# Patient Record
Sex: Female | Born: 1965
Health system: Southern US, Community
[De-identification: ages and names within clinical notes are randomized; demographics above are authoritative.]

## PROBLEM LIST (undated history)

## (undated) DIAGNOSIS — F1910 Other psychoactive substance abuse, uncomplicated: Secondary | ICD-10-CM

## (undated) DIAGNOSIS — T7840XA Allergy, unspecified, initial encounter: Secondary | ICD-10-CM

## (undated) DIAGNOSIS — K219 Gastro-esophageal reflux disease without esophagitis: Secondary | ICD-10-CM

## (undated) HISTORY — DX: Other psychoactive substance abuse, uncomplicated: F19.10

## (undated) HISTORY — DX: Allergy, unspecified, initial encounter: T78.40XA

## (undated) HISTORY — DX: Gastro-esophageal reflux disease without esophagitis: K21.9

---

## 2000-10-10 ENCOUNTER — Inpatient Hospital Stay (HOSPITAL_COMMUNITY): Admission: AD | Admit: 2000-10-10 | Discharge: 2000-10-10 | Payer: Self-pay | Admitting: *Deleted

## 2000-11-21 ENCOUNTER — Inpatient Hospital Stay (HOSPITAL_COMMUNITY): Admission: AD | Admit: 2000-11-21 | Discharge: 2000-11-21 | Payer: Self-pay | Admitting: Obstetrics and Gynecology

## 2004-08-05 ENCOUNTER — Ambulatory Visit: Payer: Self-pay | Admitting: Gynecology

## 2004-08-07 ENCOUNTER — Ambulatory Visit: Payer: Self-pay | Admitting: Gynecology

## 2006-08-10 ENCOUNTER — Ambulatory Visit: Payer: Self-pay | Admitting: Gynecology

## 2006-08-10 ENCOUNTER — Encounter (INDEPENDENT_AMBULATORY_CARE_PROVIDER_SITE_OTHER): Payer: Self-pay | Admitting: Gynecology

## 2006-08-18 ENCOUNTER — Ambulatory Visit: Payer: Self-pay | Admitting: Gynecology

## 2006-08-24 ENCOUNTER — Ambulatory Visit: Payer: Self-pay | Admitting: Gynecology

## 2007-07-02 DIAGNOSIS — L570 Actinic keratosis: Secondary | ICD-10-CM | POA: Insufficient documentation

## 2008-04-12 ENCOUNTER — Ambulatory Visit: Payer: Self-pay | Admitting: Family Medicine

## 2008-04-24 ENCOUNTER — Ambulatory Visit: Payer: Self-pay | Admitting: Family Medicine

## 2008-08-29 ENCOUNTER — Ambulatory Visit: Payer: Self-pay | Admitting: Family Medicine

## 2008-09-18 ENCOUNTER — Ambulatory Visit: Payer: Self-pay | Admitting: Family Medicine

## 2011-03-17 ENCOUNTER — Ambulatory Visit: Payer: Self-pay | Admitting: Family Medicine

## 2013-04-18 ENCOUNTER — Ambulatory Visit: Payer: Self-pay | Admitting: Family Medicine

## 2013-05-02 ENCOUNTER — Ambulatory Visit: Payer: Self-pay | Admitting: Family Medicine

## 2016-04-15 ENCOUNTER — Ambulatory Visit: Payer: Self-pay | Admitting: Family Medicine

## 2016-05-07 ENCOUNTER — Encounter: Payer: Self-pay | Admitting: Family Medicine

## 2016-05-14 ENCOUNTER — Encounter: Payer: Self-pay | Admitting: Family Medicine

## 2016-06-11 ENCOUNTER — Encounter: Payer: Self-pay | Admitting: Family Medicine

## 2016-06-17 ENCOUNTER — Encounter: Payer: Self-pay | Admitting: Family Medicine

## 2016-06-17 ENCOUNTER — Ambulatory Visit (INDEPENDENT_AMBULATORY_CARE_PROVIDER_SITE_OTHER): Payer: BLUE CROSS/BLUE SHIELD | Admitting: Family Medicine

## 2016-06-17 VITALS — BP 138/78 | HR 96 | Temp 98.1°F | Resp 16 | Ht 66.0 in | Wt 165.0 lb

## 2016-06-17 DIAGNOSIS — Z13220 Encounter for screening for lipoid disorders: Secondary | ICD-10-CM

## 2016-06-17 DIAGNOSIS — Z1239 Encounter for other screening for malignant neoplasm of breast: Secondary | ICD-10-CM

## 2016-06-17 DIAGNOSIS — Z6826 Body mass index (BMI) 26.0-26.9, adult: Secondary | ICD-10-CM | POA: Diagnosis not present

## 2016-06-17 DIAGNOSIS — Z1231 Encounter for screening mammogram for malignant neoplasm of breast: Secondary | ICD-10-CM

## 2016-06-17 DIAGNOSIS — Z131 Encounter for screening for diabetes mellitus: Secondary | ICD-10-CM

## 2016-06-17 DIAGNOSIS — Z0000 Encounter for general adult medical examination without abnormal findings: Secondary | ICD-10-CM | POA: Diagnosis not present

## 2016-06-17 DIAGNOSIS — Z124 Encounter for screening for malignant neoplasm of cervix: Secondary | ICD-10-CM

## 2016-06-17 LAB — POCT URINALYSIS DIP (MANUAL ENTRY)
BILIRUBIN UA: NEGATIVE
Glucose, UA: NEGATIVE
Ketones, POC UA: NEGATIVE
Leukocytes, UA: NEGATIVE
NITRITE UA: NEGATIVE
PH UA: 7
Protein Ur, POC: NEGATIVE
RBC UA: NEGATIVE
SPEC GRAV UA: 1.01
UROBILINOGEN UA: 0.2

## 2016-06-17 NOTE — Progress Notes (Signed)
Subjective:    Patient ID: Nicole Chavez, female    DOB: 10/07/1965, 51 y.o.   MRN: [id number]  06/17/2016  Annual Exam (WITH PAP SMEAR)   HPI This 51 y.o. female presents to establish care and for Complete Physical Examination.  Immunization History  Administered Date(s) Administered  . Influenza-Unspecified 02/12/2015, 02/10/2016   BP Readings from Last 3 Encounters:  06/17/16 138/78   Wt Readings from Last 3 Encounters:  06/17/16 165 lb (74.8 kg)      Review of Systems  Constitutional: Negative for activity change, appetite change, chills, diaphoresis, fatigue, fever and unexpected weight change.  HENT: Negative for congestion, dental problem, drooling, ear discharge, ear pain, facial swelling, hearing loss, mouth sores, nosebleeds, postnasal drip, rhinorrhea, sinus pressure, sneezing, sore throat, tinnitus, trouble swallowing and voice change.   Eyes: Negative for photophobia, pain, discharge, redness, itching and visual disturbance.  Respiratory: Negative for apnea, cough, choking, chest tightness, shortness of breath, wheezing and stridor.   Cardiovascular: Negative for chest pain, palpitations and leg swelling.  Gastrointestinal: Negative for abdominal distention, abdominal pain, anal bleeding, blood in stool, constipation, diarrhea, nausea, rectal pain and vomiting.  Endocrine: Negative for cold intolerance, heat intolerance, polydipsia, polyphagia and polyuria.  Genitourinary: Positive for vaginal discharge. Negative for decreased urine volume, difficulty urinating, dyspareunia, dysuria, enuresis, flank pain, frequency, genital sores, hematuria, menstrual problem, pelvic pain, urgency, vaginal bleeding and vaginal pain.  Musculoskeletal: Negative for arthralgias, back pain, gait problem, joint swelling, myalgias, neck pain and neck stiffness.  Skin: Negative for color change, pallor, rash and wound.  Allergic/Immunologic: Negative for environmental allergies, food  allergies and immunocompromised state.  Neurological: Negative for dizziness, tremors, seizures, syncope, facial asymmetry, speech difficulty, weakness, light-headedness, numbness and headaches.  Hematological: Negative for adenopathy. Does not bruise/bleed easily.  Psychiatric/Behavioral: Negative for agitation, behavioral problems, confusion, decreased concentration, dysphoric mood, hallucinations, self-injury, sleep disturbance and suicidal ideas. The patient is not nervous/anxious and is not hyperactive.     Past Medical History:  Diagnosis Date  . Allergy   . GERD (gastroesophageal reflux disease)   . Substance abuse    No past surgical history on file. No Known Allergies Current Outpatient Prescriptions  Medication Sig Dispense Refill  . OVER THE COUNTER MEDICATION 40 mg daily.    Marland Kitchen OVER THE COUNTER MEDICATION 10 mg daily.    Marland Kitchen lidocaine-prilocaine (EMLA) cream Apply 1 application topically as needed. 30 g 4   No current facility-administered medications for this visit.    Social History   Social History  . Marital status: Single    Spouse name: Justin Brownfield  . Number of children: 1  . Years of education: N/A   Occupational History  . unemployed    Social History Main Topics  . Smoking status: Never Smoker  . Smokeless tobacco: Never Used  . Alcohol use No  . Drug use: No     Comment: previous opiate addiction  . Sexual activity: Yes    Birth control/ protection: Post-menopausal   Other Topics Concern  . Not on file   Social History Narrative   Marital status: married to [place]: 1 son Rosana Hoes)      Lives: with husband; son in college      Employment: unemployed; Designer, jewellery in past      Tobacco: none      Alcohol: none      Drugs: opiate addiction in past; recovery since 2016  Exercise: none in 2018   No family history on file.     Objective:    BP 138/78 (BP Location: Right Arm, Cuff Size: Normal)   Pulse 96   Temp 98.1  F (36.7 C) (Oral)   Resp 16   Ht 5\' 6"  (1.676 m)   Wt 165 lb (74.8 kg)   LMP 05/31/2016   SpO2 100%   BMI 26.63 kg/m  Physical Exam  Constitutional: She is oriented to person, place, and time. She appears well-developed and well-nourished. No distress.  HENT:  Head: Normocephalic and atraumatic.  Right Ear: External ear normal.  Left Ear: External ear normal.  Nose: Nose normal.  Mouth/Throat: Oropharynx is clear and moist.  Eyes: Conjunctivae and EOM are normal. Pupils are equal, round, and reactive to light.  Neck: Normal range of motion and full passive range of motion without pain. Neck supple. No JVD present. Carotid bruit is not present. No thyromegaly present.  Cardiovascular: Normal rate, regular rhythm and normal heart sounds.  Exam reveals no gallop and no friction rub.   No murmur heard. Pulmonary/Chest: Effort normal and breath sounds normal. She has no wheezes. She has no rales. Right breast exhibits no inverted nipple, no mass, no nipple discharge, no skin change and no tenderness. Left breast exhibits no inverted nipple, no mass, no nipple discharge, no skin change and no tenderness. Breasts are symmetrical.  Abdominal: Soft. Bowel sounds are normal. She exhibits no distension and no mass. There is no tenderness. There is no rebound and no guarding.  Genitourinary: Vagina normal and uterus normal. There is no rash, tenderness or lesion on the right labia. There is no rash, tenderness, lesion or injury on the left labia. Cervix exhibits no motion tenderness. Right adnexum displays no mass, no tenderness and no fullness. Left adnexum displays no mass, no tenderness and no fullness. No vaginal discharge found.  Musculoskeletal:       Right shoulder: Normal.       Left shoulder: Normal.       Cervical back: Normal.  Lymphadenopathy:    She has no cervical adenopathy.  Neurological: She is alert and oriented to person, place, and time. She has normal reflexes. No cranial  nerve deficit. She exhibits normal muscle tone. Coordination normal.  Skin: Skin is warm and dry. No rash noted. She is not diaphoretic. No erythema. No pallor.  Psychiatric: She has a normal mood and affect. Her behavior is normal. Judgment and thought content normal.  Nursing note and vitals reviewed.   Depression screen PHQ 2/9 06/17/2016  Decreased Interest 0  Down, Depressed, Hopeless 0  PHQ - 2 Score 0       Assessment & Plan:   1. Routine physical examination   2. Cervical cancer screening   3. Screening for diabetes mellitus   4. Screening, lipid   5. Screening for breast cancer     Orders Placed This Encounter  Procedures  . POCT urinalysis dipstick   Meds ordered this encounter  Medications  . OVER THE COUNTER MEDICATION    Sig: 40 mg daily.  Marland Kitchen OVER THE COUNTER MEDICATION    Sig: 10 mg daily.  Marland Kitchen lidocaine-prilocaine (EMLA) cream    Sig: Apply 1 application topically as needed.    Dispense:  30 g    Refill:  4    No Follow-up on file.   Shaleka Brines Elayne Guerin, M.D. Primary Care at [facility] [address]  [place], [place]  [address] [phone] phone [phone] fax

## 2016-06-17 NOTE — Patient Instructions (Addendum)
   IF you received an x-ray today, you will receive an invoice from [facility]. Please contact [facility] at [phone] with questions or concerns regarding your invoice.   IF you received labwork today, you will receive an invoice from [facility]. Please contact [facility] at [phone] with questions or concerns regarding your invoice.   Our billing staff will not be able to assist you with questions regarding bills from these companies.  You will be contacted with the lab results as soon as they are available. The fastest way to get your results is to activate your My Chart account. Instructions are located on the last page of this paperwork. If you have not heard from us regarding the results in 2 weeks, please contact this office.     Keeping You Healthy  Get These Tests  Blood Pressure- Have your blood pressure checked by your healthcare provider at least once a year.  Normal blood pressure is 120/80.  Weight- Have your body mass index (BMI) calculated to screen for obesity.  BMI is a measure of body fat based on height and weight.  You can calculate your own BMI at www.nhlbisupport.com/bmi/  Cholesterol- Have your cholesterol checked every year.  Diabetes- Have your blood sugar checked every year if you have high blood pressure, high cholesterol, a family history of diabetes or if you are overweight.  Pap Test - Have a pap test every 1 to 5 years if you have been sexually active.  If you are older than 65 and recent pap tests have been normal you may not need additional pap tests.  In addition, if you have had a hysterectomy  for benign disease additional pap tests are not necessary.  Mammogram-Yearly mammograms are essential for early detection of breast cancer  Screening for Colon Cancer- Colonoscopy starting at age 50. Screening may begin sooner depending on your family history and other health conditions.  Follow up colonoscopy as directed by your  Gastroenterologist.  Screening for Osteoporosis- Screening begins at age 65 with bone density scanning, sooner if you are at higher risk for developing Osteoporosis.  Get these medicines  Calcium with Vitamin D- Your body requires 1200-1500 mg of Calcium a day and 800-1000 IU of Vitamin D a day.  You can only absorb 500 mg of Calcium at a time therefore Calcium must be taken in 2 or 3 separate doses throughout the day.  Hormones- Hormone therapy has been associated with increased risk for certain cancers and heart disease.  Talk to your healthcare provider about if you need relief from menopausal symptoms.  Aspirin- Ask your healthcare provider about taking Aspirin to prevent Heart Disease and Stroke.  Get these Immuniztions  Flu shot- Every fall  Pneumonia shot- Once after the age of 65; if you are younger ask your healthcare provider if you need a pneumonia shot.  Tetanus- Every ten years.  Zostavax- Once after the age of 60 to prevent shingles.  Take these steps  Don't smoke- Your healthcare provider can help you quit. For tips on how to quit, ask your healthcare provider or go to www.smokefree.gov or call 1-800 QUIT-NOW.  Be physically active- Exercise 5 days a week for a minimum of 30 minutes.  If you are not already physically active, start slow and gradually work up to 30 minutes of moderate physical activity.  Try walking, dancing, bike riding, swimming, etc.  Eat a healthy diet- Eat a variety of healthy foods such as fruits, vegetables, whole grains, low   fat milk, low fat cheeses, yogurt, lean meats, chicken, fish, eggs, dried beans, tofu, etc.  For more information go to www.thenutritionsource.org  Dental visit- Brush and floss teeth twice daily; visit your dentist twice a year.  Eye exam- Visit your Optometrist or Ophthalmologist yearly.  Drink alcohol in moderation- Limit alcohol intake to one drink or less a day.  Never drink and drive.  Depression- Your emotional  health is as important as your physical health.  If you're feeling down or losing interest in things you normally enjoy, please talk to your healthcare provider.  Seat Belts- can save your life; always wear one  Smoke/Carbon Monoxide detectors- These detectors need to be installed on the appropriate level of your home.  Replace batteries at least once a year.  Violence- If anyone is threatening or hurting you, please tell your healthcare provider.  Living Will/ Health care power of attorney- Discuss with your healthcare provider and family.  

## 2016-06-19 LAB — PAP IG AND HPV HIGH-RISK
HPV, high-risk: NEGATIVE
PAP Smear Comment: 0

## 2016-07-14 ENCOUNTER — Encounter: Payer: Self-pay | Admitting: Family Medicine

## 2016-07-14 MED ORDER — LIDOCAINE-PRILOCAINE 2.5-2.5 % EX CREA: 1.0000 "application " | TOPICAL_CREAM | CUTANEOUS | 4 refills | Status: AC | PRN

## 2016-11-18 ENCOUNTER — Telehealth: Payer: Self-pay | Admitting: Family Medicine

## 2016-11-18 NOTE — Telephone Encounter (Signed)
Per telephone conversation pt stated that she has a mild vaginal odor with clear to white discharge. She states that when she was in for her CPE in 06/17/2016, that Dr. Tamala Julian had told her that when she gets these symptoms to call and let her know and a Rx for Metrogel would be sent  over to [place]. [place] the beach next [place] and would like Rx.

## 2016-11-18 NOTE — Telephone Encounter (Signed)
Pt is needing a refill on her metrogel   Please call in to [place] number [phone]

## 2016-11-19 NOTE — Telephone Encounter (Signed)
Please send in rx if appropriate. Thanks

## 2016-11-21 MED ORDER — METRONIDAZOLE 0.75 % VA GEL: 1.0000 | Freq: Two times a day (BID) | VAGINAL | 3 refills | Status: DC

## 2016-11-21 NOTE — Telephone Encounter (Signed)
DR Tamala Julian BRIONA ASK ME TO FORWARD THIS TO YOU PT IS LEAVING TODAY TO GO TO THE BEACH AROUND 1:00PM SHE IS CALLING FOR HER RX FOR METROGEL

## 2017-07-21 DIAGNOSIS — L719 Rosacea, unspecified: Secondary | ICD-10-CM | POA: Insufficient documentation

## 2017-07-28 ENCOUNTER — Other Ambulatory Visit: Payer: Self-pay | Admitting: Family Medicine

## 2017-07-28 DIAGNOSIS — Z1239 Encounter for other screening for malignant neoplasm of breast: Secondary | ICD-10-CM

## 2017-07-30 ENCOUNTER — Ambulatory Visit
Admission: RE | Admit: 2017-07-30 | Discharge: 2017-07-30 | Disposition: A | Discharge status: Home / Self Care | Payer: BLUE CROSS/BLUE SHIELD | Source: Ambulatory Visit | Attending: Family Medicine | Admitting: Family Medicine

## 2017-07-30 ENCOUNTER — Encounter: Payer: Self-pay | Admitting: Radiology

## 2017-07-30 DIAGNOSIS — Z1239 Encounter for other screening for malignant neoplasm of breast: Secondary | ICD-10-CM

## 2017-07-30 DIAGNOSIS — Z1231 Encounter for screening mammogram for malignant neoplasm of breast: Secondary | ICD-10-CM | POA: Insufficient documentation

## 2017-10-05 ENCOUNTER — Encounter: Payer: Self-pay | Admitting: Family Medicine

## 2018-11-04 ENCOUNTER — Telehealth: Payer: Self-pay | Admitting: *Deleted

## 2018-11-04 ENCOUNTER — Other Ambulatory Visit: Payer: BLUE CROSS/BLUE SHIELD

## 2018-11-04 DIAGNOSIS — Z20822 Contact with and (suspected) exposure to covid-19: Secondary | ICD-10-CM

## 2018-11-04 NOTE — Telephone Encounter (Signed)
Received call from [place] at [facility] Department requesting patient be tested for COVID 19.  Scheduled patient for today at 10:45am at [facility].  Testing protocol reviewed.

## 2018-11-11 LAB — NOVEL CORONAVIRUS, NAA: SARS-CoV-2, NAA: DETECTED — AB

## 2018-12-01 DIAGNOSIS — F1911 Other psychoactive substance abuse, in remission: Secondary | ICD-10-CM | POA: Insufficient documentation

## 2019-01-20 ENCOUNTER — Other Ambulatory Visit: Payer: Self-pay

## 2019-01-20 DIAGNOSIS — Z20822 Contact with and (suspected) exposure to covid-19: Secondary | ICD-10-CM

## 2019-01-21 LAB — NOVEL CORONAVIRUS, NAA: SARS-CoV-2, NAA: NOT DETECTED

## 2019-07-28 ENCOUNTER — Other Ambulatory Visit: Payer: Self-pay | Admitting: Family Medicine

## 2019-07-28 DIAGNOSIS — Z1231 Encounter for screening mammogram for malignant neoplasm of breast: Secondary | ICD-10-CM

## 2019-07-28 DIAGNOSIS — Z0000 Encounter for general adult medical examination without abnormal findings: Secondary | ICD-10-CM

## 2019-07-28 DIAGNOSIS — Z8041 Family history of malignant neoplasm of ovary: Secondary | ICD-10-CM

## 2019-08-04 ENCOUNTER — Ambulatory Visit
Admission: RE | Admit: 2019-08-04 | Discharge: 2019-08-04 | Disposition: A | Discharge status: Home / Self Care | Payer: 59 | Source: Ambulatory Visit | Attending: Family Medicine | Admitting: Family Medicine

## 2019-08-04 ENCOUNTER — Other Ambulatory Visit: Payer: Self-pay

## 2019-08-04 DIAGNOSIS — Z8041 Family history of malignant neoplasm of ovary: Secondary | ICD-10-CM

## 2019-08-04 DIAGNOSIS — Z1231 Encounter for screening mammogram for malignant neoplasm of breast: Secondary | ICD-10-CM

## 2019-08-04 DIAGNOSIS — Z0000 Encounter for general adult medical examination without abnormal findings: Secondary | ICD-10-CM

## 2019-08-18 DIAGNOSIS — I10 Essential (primary) hypertension: Secondary | ICD-10-CM | POA: Insufficient documentation

## 2019-08-28 DIAGNOSIS — J301 Allergic rhinitis due to pollen: Secondary | ICD-10-CM | POA: Insufficient documentation

## 2019-08-28 DIAGNOSIS — K219 Gastro-esophageal reflux disease without esophagitis: Secondary | ICD-10-CM | POA: Insufficient documentation

## 2019-12-13 ENCOUNTER — Telehealth (INDEPENDENT_AMBULATORY_CARE_PROVIDER_SITE_OTHER): Payer: Self-pay | Admitting: Gastroenterology

## 2019-12-13 ENCOUNTER — Other Ambulatory Visit: Payer: Self-pay

## 2019-12-13 DIAGNOSIS — Z1211 Encounter for screening for malignant neoplasm of colon: Secondary | ICD-10-CM

## 2019-12-13 NOTE — Progress Notes (Signed)
Gastroenterology Pre-Procedure Review  Request Date: 01/17/20 Requesting Physician: Dr. Allen Norris  PATIENT REVIEW QUESTIONS: The patient responded to the following health history questions as indicated:    1. Are you having any GI issues? no 2. Do you have a personal history of Polyps? no 3. Do you have a family history of Colon Cancer or Polyps? no 4. Diabetes Mellitus? no 5. Joint replacements in the past 12 months?no 6. Major health problems in the past 3 months?no 7. Any artificial heart valves, MVP, or defibrillator?no    MEDICATIONS & ALLERGIES:    Patient reports the following regarding taking any anticoagulation/antiplatelet therapy:   Plavix, Coumadin, Eliquis, Xarelto, Lovenox, Pradaxa, Brilinta, or Effient? no Aspirin? no  Patient confirms/reports the following medications:  Current Outpatient Medications  Medication Sig Dispense Refill  . esomeprazole (NEXIUM) 20 MG capsule Take by mouth.    . hydrochlorothiazide (HYDRODIURIL) 12.5 MG tablet Take 12.5 mg by mouth daily.    Marland Kitchen losartan (COZAAR) 50 MG tablet     . OVER THE COUNTER MEDICATION 10 mg daily.    Marland Kitchen lidocaine-prilocaine (EMLA) cream Apply 1 application topically as needed. (Patient not taking: Reported on 12/13/2019) 30 g 4  . loratadine (CLARITIN) 10 MG tablet Take by mouth.     No current facility-administered medications for this visit.    Patient confirms/reports the following allergies:  No Known Allergies  No orders of the defined types were placed in this encounter.   AUTHORIZATION INFORMATION Primary Insurance: 1D#: Group #:  Secondary Insurance: 1D#: Group #:  SCHEDULE INFORMATION: Date: 01/17/20 Time: Location:[facility]

## 2020-01-13 ENCOUNTER — Other Ambulatory Visit
Admission: RE | Admit: 2020-01-13 | Discharge: 2020-01-13 | Disposition: A | Discharge status: Home / Self Care | Payer: 59 | Source: Ambulatory Visit | Attending: Gastroenterology | Admitting: Gastroenterology

## 2020-01-13 ENCOUNTER — Other Ambulatory Visit: Payer: Self-pay

## 2020-01-13 DIAGNOSIS — Z01818 Encounter for other preprocedural examination: Secondary | ICD-10-CM | POA: Diagnosis present

## 2020-01-13 DIAGNOSIS — Z20822 Contact with and (suspected) exposure to covid-19: Secondary | ICD-10-CM | POA: Diagnosis not present

## 2020-01-13 LAB — SARS CORONAVIRUS 2 (TAT 6-24 HRS): SARS Coronavirus 2: NEGATIVE

## 2020-01-17 ENCOUNTER — Encounter: Payer: Self-pay | Admitting: Gastroenterology

## 2020-01-17 ENCOUNTER — Encounter
Admission: RE | Disposition: A | Discharge status: Home / Self Care | Payer: Self-pay | Source: Home / Self Care | Attending: Gastroenterology

## 2020-01-17 ENCOUNTER — Ambulatory Visit: Payer: 59 | Admitting: Anesthesiology

## 2020-01-17 ENCOUNTER — Other Ambulatory Visit: Payer: Self-pay

## 2020-01-17 ENCOUNTER — Ambulatory Visit
Admission: RE | Admit: 2020-01-17 | Discharge: 2020-01-17 | Disposition: A | Discharge status: Home / Self Care | Payer: 59 | Attending: Gastroenterology | Admitting: Gastroenterology

## 2020-01-17 DIAGNOSIS — Z1211 Encounter for screening for malignant neoplasm of colon: Secondary | ICD-10-CM | POA: Insufficient documentation

## 2020-01-17 DIAGNOSIS — I10 Essential (primary) hypertension: Secondary | ICD-10-CM | POA: Diagnosis not present

## 2020-01-17 DIAGNOSIS — K644 Residual hemorrhoidal skin tags: Secondary | ICD-10-CM | POA: Insufficient documentation

## 2020-01-17 DIAGNOSIS — K219 Gastro-esophageal reflux disease without esophagitis: Secondary | ICD-10-CM | POA: Diagnosis not present

## 2020-01-17 DIAGNOSIS — K640 First degree hemorrhoids: Secondary | ICD-10-CM | POA: Diagnosis not present

## 2020-01-17 DIAGNOSIS — Z79899 Other long term (current) drug therapy: Secondary | ICD-10-CM | POA: Insufficient documentation

## 2020-01-17 HISTORY — PX: COLONOSCOPY WITH PROPOFOL: SHX5780

## 2020-01-17 SURGERY — COLONOSCOPY WITH PROPOFOL
Anesthesia: General

## 2020-01-17 MED ORDER — PROPOFOL 10 MG/ML IV BOLUS
INTRAVENOUS | Status: DC | PRN
  Administered 2020-01-17: 50 mg via INTRAVENOUS
  Administered 2020-01-17 (×2): 20 mg via INTRAVENOUS

## 2020-01-17 MED ORDER — SODIUM CHLORIDE 0.9 % IV SOLN: INTRAVENOUS | Status: DC

## 2020-01-17 MED ORDER — LIDOCAINE 2% (20 MG/ML) 5 ML SYRINGE
INTRAMUSCULAR | Status: DC | PRN
  Administered 2020-01-17: 40 mg via INTRAVENOUS

## 2020-01-17 MED ORDER — PROPOFOL 500 MG/50ML IV EMUL
INTRAVENOUS | Status: DC | PRN
  Administered 2020-01-17: 175 ug/kg/min via INTRAVENOUS

## 2020-01-17 NOTE — Transfer of Care (Signed)
Immediate Anesthesia Transfer of Care Note  Patient: Nicole Chavez  Procedure(s) Performed: COLONOSCOPY WITH PROPOFOL (N/A )  Patient Location: Endoscopy Unit  Anesthesia Type:General  Level of Consciousness: awake, alert  and oriented  Airway & Oxygen Therapy: Patient connected to nasal cannula oxygen  Post-op Assessment: Post -op Vital signs reviewed and stable  Post vital signs: stable  Last Vitals:  Vitals Value Taken Time  BP 120/77   Temp    Pulse 79 01/17/20 0920  Resp 16 01/17/20 0920  SpO2 100 % 01/17/20 0920  Vitals shown include unvalidated device data.  Last Pain:  Vitals:   01/17/20 0832  TempSrc: Temporal  PainSc: 0-No pain         Complications: No complications documented.

## 2020-01-17 NOTE — Anesthesia Postprocedure Evaluation (Signed)
Anesthesia Post Note  Patient: Nicole Chavez  Procedure(s) Performed: COLONOSCOPY WITH PROPOFOL (N/A )  Patient location during evaluation: Endoscopy Anesthesia Type: General Level of consciousness: awake and alert Pain management: pain level controlled Vital Signs Assessment: post-procedure vital signs reviewed and stable Respiratory status: spontaneous breathing and respiratory function stable Cardiovascular status: stable Anesthetic complications: no   No complications documented.   Last Vitals:  Vitals:   01/17/20 0832 01/17/20 0920  BP: (!) 164/84 120/77  Pulse: 91 80  Resp: 16 15  Temp: 36.6 C 36.7 C  SpO2: 100% 100%    Last Pain:  Vitals:   01/17/20 0940  TempSrc:   PainSc: 0-No pain                 Koa Palla K

## 2020-01-17 NOTE — Anesthesia Preprocedure Evaluation (Signed)
Anesthesia Evaluation  Patient identified by MRN, date of birth, ID band Patient awake    Reviewed: Allergy & Precautions, NPO status , Patient's Chart, lab work & pertinent test results  History of Anesthesia Complications Negative for: history of anesthetic complications  Airway Mallampati: III       Dental   Pulmonary neg sleep apnea, neg COPD, Not current smoker,           Cardiovascular hypertension, Pt. on medications (-) Past MI and (-) CHF (-) dysrhythmias (-) Valvular Problems/Murmurs     Neuro/Psych neg Seizures    GI/Hepatic Neg liver ROS, GERD  Medicated and Controlled,  Endo/Other  neg diabetes  Renal/GU negative Renal ROS     Musculoskeletal   Abdominal   Peds  Hematology   Anesthesia Other Findings   Reproductive/Obstetrics                             Anesthesia Physical Anesthesia Plan  ASA: II  Anesthesia Plan: General   Post-op Pain Management:    Induction: Intravenous  PONV Risk Score and Plan: Propofol infusion, TIVA and Treatment may vary due to age or medical condition  Airway Management Planned: Nasal Cannula  Additional Equipment:   Intra-op Plan:   Post-operative Plan:   Informed Consent: I have reviewed the patients History and Physical, chart, labs and discussed the procedure including the risks, benefits and alternatives for the proposed anesthesia with the patient or authorized representative who has indicated his/her understanding and acceptance.       Plan Discussed with:   Anesthesia Plan Comments:         Anesthesia Quick Evaluation

## 2020-01-17 NOTE — Op Note (Signed)
[facility] Patient Name: Nicole Chavez Procedure Date: 01/17/2020 8:55 AM MRN: [id number] Account #: [id number] Date of Birth: November 22, 1965 Admit Type: Outpatient Age: 54 Room: [facility] ENDO ROOM 4 Gender: Female Note Status: Finalized Procedure:             Colonoscopy Indications:           Screening for colorectal malignant neoplasm Providers:             Lucilla Lame MD, MD Referring MD:          Renette Butters. Tamala Julian, MD (Referring MD) Medicines:             Propofol per Anesthesia Complications:         No immediate complications. Procedure:             Pre-Anesthesia Assessment:                        - Prior to the procedure, a History and Physical was                         performed, and patient medications and allergies were                         reviewed. The patient's tolerance of previous                         anesthesia was also reviewed. The risks and benefits                         of the procedure and the sedation options and risks                         were discussed with the patient. All questions were                         answered, and informed consent was obtained. Prior                         Anticoagulants: The patient has taken no previous                         anticoagulant or antiplatelet agents. ASA Grade                         Assessment: II - A patient with mild systemic disease.                         After reviewing the risks and benefits, the patient                         was deemed in satisfactory condition to undergo the                         procedure.                        After obtaining informed consent, the colonoscope was  passed under direct vision. Throughout the procedure,                         the patient's blood pressure, pulse, and oxygen                         saturations were monitored continuously. The                         Colonoscope was introduced through the  anus and                         advanced to the the cecum, identified by appendiceal                         orifice and ileocecal valve. The colonoscopy was                         performed without difficulty. The patient tolerated                         the procedure well. The quality of the bowel                         preparation was excellent. Findings:      Skin tags were found on perianal exam.      Non-bleeding internal hemorrhoids were found during retroflexion. The       hemorrhoids were Grade I (internal hemorrhoids that do not prolapse).      The exam was otherwise without abnormality. Impression:            - Perianal skin tags found on perianal exam.                        - Non-bleeding internal hemorrhoids.                        - The examination was otherwise normal.                        - No specimens collected. Recommendation:        - Discharge patient to home.                        - Resume previous diet.                        - Continue present medications.                        - Repeat colonoscopy in 10 years for screening unless                         any change in family history or lower GI problems. Procedure Code(s):     --- Professional ---                        [phone], Colonoscopy, flexible; diagnostic, including  collection of specimen(s) by brushing or washing, when                         performed (separate procedure) Diagnosis Code(s):     --- Professional ---                        Z12.11, Encounter for screening for malignant neoplasm                         of colon CPT copyright 2019 American Medical Association. All rights reserved. The codes documented in this report are preliminary and upon coder review may  be revised to meet current compliance requirements. Lucilla Lame MD, MD 01/17/2020 9:18:14 AM This report has been signed electronically. Number of Addenda: 0 Note Initiated On: 01/17/2020 8:55 AM Scope  Withdrawal Time: 0 hours 7 minutes 51 seconds  Total Procedure Duration: 0 hours 10 minutes 50 seconds  Estimated Blood Loss:  Estimated blood loss: none.      [facility]

## 2020-01-17 NOTE — H&P (Signed)
Nicole Lame, MD [address] [place], [place] [address] Phone: [phone] Fax : [phone]  Primary Care Physician:  Nicole Honour, MD Primary Gastroenterologist:  Dr. Allen Norris  Pre-Procedure History & Physical: HPI:  Nicole Chavez is a 54 y.o. female is here for a screening colonoscopy.   Past Medical History:  Diagnosis Date  . Allergy   . GERD (gastroesophageal reflux disease)   . Substance abuse ([place])     History reviewed. No pertinent surgical history.  Prior to Admission medications   Medication Sig Start Date End Date Taking? Authorizing Provider  esomeprazole (NEXIUM) 20 MG capsule Take by mouth. 12/06/19  Yes [provider]  hydrochlorothiazide (HYDRODIURIL) 12.5 MG tablet Take 12.5 mg by mouth daily. 09/19/19  Yes [provider]  loratadine (CLARITIN) 10 MG tablet Take by mouth.   Yes [provider]  losartan (COZAAR) 50 MG tablet  11/18/19  Yes [provider]  lidocaine-prilocaine (EMLA) cream Apply 1 application topically as needed. Patient not taking: Reported on 12/13/2019 07/14/16   Nicole Honour, MD  OVER THE COUNTER MEDICATION 10 mg daily. Patient not taking: Reported on 01/17/2020    [provider]    Allergies as of 12/13/2019  . (No Known Allergies)    Family History  Problem Relation Age of Onset  . Breast cancer Maternal Aunt   . Breast cancer Maternal Grandmother   . Breast cancer Maternal Grandfather   . Breast cancer Other     Social History   Socioeconomic History  . Marital status: Married    Spouse name: Nicole Chavez  . Number of children: 1  . Years of education: Not on file  . Highest education level: Not on file  Occupational History  . Occupation: unemployed  Tobacco Use  . Smoking status: Never Smoker  . Smokeless tobacco: Never Used  Vaping Use  . Vaping Use: Never used  Substance and Sexual Activity  . Alcohol use: Yes    Alcohol/week: 15.0 standard drinks     Types: 15 Glasses of wine per week  . Drug use: No    Comment: previous opiate addiction  . Sexual activity: Yes    Birth control/protection: Post-menopausal  Other Topics Concern  . Not on file  Social History Narrative   Marital status: married to Nicole Chavez: 1 son Nicole Chavez)      Lives: with husband; son in college      Employment: unemployed; Designer, jewellery in past      Tobacco: none      Alcohol: none      Drugs: opiate addiction in past; recovery since 2016      Exercise: none in 2018   Social Determinants of Health   Financial Resource Strain:   . Difficulty of Paying Living Expenses: Not on file  Food Insecurity:   . Worried About Charity fundraiser in the Last Year: Not on file  . Ran Out of Food in the Last Year: Not on file  Transportation Needs:   . Lack of Transportation (Medical): Not on file  . Lack of Transportation (Non-Medical): Not on file  Physical Activity:   . Days of Exercise per Week: Not on file  . Minutes of Exercise per Session: Not on file  Stress:   . Feeling of Stress : Not on file  Social Connections:   . Frequency of Communication with Friends and Family: Not on file  .  Frequency of Social Gatherings with Friends and Family: Not on file  . Attends Religious Services: Not on file  . Active Member of Clubs or Organizations: Not on file  . Attends Archivist Meetings: Not on file  . Marital Status: Not on file  Intimate Partner Violence:   . Fear of Current or Ex-Partner: Not on file  . Emotionally Abused: Not on file  . Physically Abused: Not on file  . Sexually Abused: Not on file    Review of Systems: See HPI, otherwise negative ROS  Physical Exam: LMP 07/14/2019 (Approximate) Comment: neg preg test 01/17/20 General:   Alert,  pleasant and cooperative in NAD Head:  Normocephalic and atraumatic. Neck:  Supple; no masses or thyromegaly. Lungs:  Clear throughout to auscultation.    Heart:  Regular rate and  rhythm. Abdomen:  Soft, nontender and nondistended. Normal bowel sounds, without guarding, and without rebound.   Neurologic:  Alert and  oriented x4;  grossly normal neurologically.  Impression/Plan: Nicole Chavez is now here to undergo a screening colonoscopy.  Risks, benefits, and alternatives regarding colonoscopy have been reviewed with the patient.  Questions have been answered.  All parties agreeable.

## 2020-01-18 ENCOUNTER — Encounter: Payer: Self-pay | Admitting: Gastroenterology

## 2020-03-14 ENCOUNTER — Encounter: Payer: Self-pay | Admitting: Dermatology

## 2020-03-14 ENCOUNTER — Ambulatory Visit: Payer: BC Managed Care – PPO | Admitting: Dermatology

## 2020-03-14 ENCOUNTER — Other Ambulatory Visit: Payer: Self-pay

## 2020-03-14 DIAGNOSIS — L578 Other skin changes due to chronic exposure to nonionizing radiation: Secondary | ICD-10-CM

## 2020-03-14 DIAGNOSIS — Z1283 Encounter for screening for malignant neoplasm of skin: Secondary | ICD-10-CM | POA: Diagnosis not present

## 2020-03-14 DIAGNOSIS — L309 Dermatitis, unspecified: Secondary | ICD-10-CM

## 2020-03-14 DIAGNOSIS — B079 Viral wart, unspecified: Secondary | ICD-10-CM | POA: Diagnosis not present

## 2020-03-14 DIAGNOSIS — L905 Scar conditions and fibrosis of skin: Secondary | ICD-10-CM | POA: Diagnosis not present

## 2020-03-14 DIAGNOSIS — L814 Other melanin hyperpigmentation: Secondary | ICD-10-CM

## 2020-03-14 DIAGNOSIS — D2361 Other benign neoplasm of skin of right upper limb, including shoulder: Secondary | ICD-10-CM

## 2020-03-14 DIAGNOSIS — L570 Actinic keratosis: Secondary | ICD-10-CM | POA: Diagnosis not present

## 2020-03-14 DIAGNOSIS — D1800 Hemangioma unspecified site: Secondary | ICD-10-CM

## 2020-03-14 DIAGNOSIS — L821 Other seborrheic keratosis: Secondary | ICD-10-CM

## 2020-03-14 DIAGNOSIS — L988 Other specified disorders of the skin and subcutaneous tissue: Secondary | ICD-10-CM

## 2020-03-14 DIAGNOSIS — D229 Melanocytic nevi, unspecified: Secondary | ICD-10-CM

## 2020-03-14 DIAGNOSIS — D239 Other benign neoplasm of skin, unspecified: Secondary | ICD-10-CM

## 2020-03-14 MED ORDER — TRETINOIN 0.05 % EX CREA: TOPICAL_CREAM | Freq: Every evening | CUTANEOUS | 2 refills | Status: AC

## 2020-03-14 MED ORDER — BETAMETHASONE DIPROPIONATE 0.05 % EX CREA: TOPICAL_CREAM | Freq: Two times a day (BID) | CUTANEOUS | 2 refills | Status: AC | PRN

## 2020-03-14 NOTE — Progress Notes (Signed)
New Patient Visit  Subjective  Nicole Chavez is a 54 y.o. female who presents for the following: New Patient Skin Cancer Screening.  Patient here for full body skin exam and skin cancer screening. No history of skin cancer but patient thinks she may have had a bx proven AK, was not able to pull up pathology report in [place]. She was being followed by [facility] dermatology but had insurance change so has switched to our office.  She has red splotchiness at her legs, arms and chest for over a year and some are scaly. Patient also has a few spots circled that she would like checked.  Patient has been treated for eczema in the past with diprolene and would like to see about getting a refill, as well as one for tretinoin 0.05% cream.  The following portions of the chart were reviewed this encounter and updated as appropriate:  Tobacco  Allergies  Meds  Problems  Med Hx  Surg Hx  Fam Hx      Review of Systems:  No other skin or systemic complaints except as noted in HPI or Assessment and Plan.  Objective  Well appearing patient in no apparent distress; mood and affect are within normal limits.  A full examination was performed including scalp, head, eyes, ears, nose, lips, neck, chest, axillae, abdomen, back, buttocks, bilateral upper extremities, bilateral lower extremities, hands, feet, fingers, toes, fingernails, and toenails. All findings within normal limits unless otherwise noted below.  Objective  L Forearm x 1, L upper arm x 1, R forearm x 1, R 3rd finger x 1 (4): Erythematous thin papules/macules with gritty scale.   Objective  Left leg: Verrucous papules   Objective  Right alar crease: Dyspigmented smooth macule or patch.   Objective  Right Shoulder - Posterior: Firm pink/brown papule nodule with dimple sign.   Objective  Left Forearm - Anterior: History of eczema  Objective  Face: Rhytides and volume loss.    Assessment & Plan  AK  (actinic keratosis) (4) L Forearm x 1, L upper arm x 1, R forearm x 1, R 3rd finger x 1  Prior to procedure, discussed risks of blister formation, small wound, skin dyspigmentation, or rare scar following cryotherapy.    Destruction of lesion - L Forearm x 1, L upper arm x 1, R forearm x 1, R 3rd finger x 1 Complexity: simple   Destruction method: cryotherapy   Informed consent: discussed and consent obtained   Lesion destroyed using liquid nitrogen: Yes   Cryotherapy cycles:  2 Outcome: patient tolerated procedure well with no complications   Post-procedure details: wound care instructions given    Viral warts, unspecified type Left leg  Benign-appearing. Discussed viral etiology and risk of spread.  Discussed multiple treatments may be required to clear warts.  Discussed possible post-treatment dyspigmentation and risk of recurrence. Treatment deferred today.  Will RTC if any changes.   Scar Right alar crease  Present since childhood, previous biopsy done with no abnormal results.   Dermatofibroma Right Shoulder - Posterior  Benign-appearing.  Observation.  Call clinic for new or changing lesions.  Recommend daily use of broad spectrum spf 30+ sunscreen to sun-exposed areas.    Eczema, unspecified type Left Forearm - Anterior  Well-controlled.   Cont diprolene one to two times daily as needed for eczema flares. Avoid applying to face, groin, and axilla. Use as directed. Risk of skin atrophy with long-term use reviewed.  Topical steroids (such as triamcinolone, fluocinolone, fluocinonide, mometasone, clobetasol, halobetasol, betamethasone, hydrocortisone) can cause thinning and lightening of the skin if they are used for too long in the same area. Your physician has selected the right strength medicine for your problem and area affected on the body. Please use your medication only as directed by your physician to prevent side effects.    Ordered  Medications: betamethasone dipropionate 0.05 % cream  Elastosis of skin Face  Continue tretinoin 0.05% cream to entire face nightly as directed   Topical retinoid medications like tretinoin/Retin-A, adapalene/Differin, tazarotene/Fabior, and Epiduo/Epiduo Forte can cause dryness and irritation when first started. Only apply a pea-sized amount to the entire affected area. Avoid applying it around the eyes, edges of mouth and creases at the nose. If you experience irritation, use a good moisturizer first and/or apply the medicine less often. If you are doing well with the medicine, you can increase how often you use it until you are applying every night. Be careful with sun protection while using this medication as it can make you sensitive to the sun. This medicine should not be used by pregnant women.    Ordered Medications: tretinoin (RETIN-A) 0.05 % cream   Lentigines - Scattered tan macules - Discussed due to sun exposure - Benign, observe - Call for any changes  Seborrheic Keratoses - Stuck-on, waxy, tan-brown papules and plaques  - Discussed benign etiology and prognosis. - Observe - Call for any changes  Melanocytic Nevi - Tan-brown and/or pink-flesh-colored symmetric macules and papules - Benign appearing on exam today - Observation - Call clinic for new or changing moles - Recommend daily use of broad spectrum spf 30+ sunscreen to sun-exposed areas.   Hemangiomas - Red papules - Discussed benign nature - Observe - Call for any changes  Actinic Damage - Chronic, secondary to cumulative UV/sun exposure - diffuse scaly erythematous macules with underlying dyspigmentation - Recommend daily broad spectrum sunscreen SPF 30+ to sun-exposed areas, reapply every 2 hours as needed.  - Call for new or changing lesions. - Severe, chronic at chest - Discussed "Field Treatment" for Severe, Confluent Actinic Changes with Pre-Cancerous Actinic Keratoses due to cumulative sun  exposure/UV radiation exposure over time - Patient will plan to start [facility] 5FU/calcipotriene to chest for 7 days following vacation in December.  Reviewed expected reaction. Field treatment involves treatment of an entire area of skin that has confluent Actinic Changes (Sun/ Ultraviolet light damage) and PreCancerous Actinic Keratoses by method of PhotoDynamic Therapy (PDT) and/or prescription Topical Chemotherapy agents such as 5-fluorouracil, 5-fluorouracil/calcipotriene, and/or imiquimod.  The purpose is to decrease the number of clinically evident and subclinical PreCancerous lesions to prevent progression to development of skin cancer by chemically destroying early precancer changes that may or may not be visible.  It has been shown to reduce the risk of developing skin cancer in the treated area. As a result of treatment, redness, scaling, crusting, and open sores may occur during treatment course. One or more than one of these methods may be used and may have to be used several times to control, suppress and eliminate the PreCancerous changes. Discussed treatment course, expected reaction, and possible side effects.  Skin cancer screening performed today.   Return in about 2 months (around 05/14/2020) for AK follow up.  Graciella Belton, RMA, am acting as scribe for Forest Gleason, MD .  Documentation: I have reviewed the above documentation for accuracy and completeness, and I agree with the above.  Forest Gleason, MD

## 2020-03-14 NOTE — Patient Instructions (Addendum)
Melanoma ABCDEs  Melanoma is the most dangerous type of skin cancer, and is the leading cause of death from skin disease.  You are more likely to develop melanoma if you:  Have light-colored skin, light-colored eyes, or red or blond hair  Spend a lot of time in the sun  Tan regularly, either outdoors or in a tanning bed  Have had blistering sunburns, especially during childhood  Have a close family member who has had a melanoma  Have atypical moles or large birthmarks  Early detection of melanoma is key since treatment is typically straightforward and cure rates are extremely high if we catch it early.   The first sign of melanoma is often a change in a mole or a new dark spot.  The ABCDE system is a way of remembering the signs of melanoma.  A for asymmetry:  The two halves do not match. B for border:  The edges of the growth are irregular. C for color:  A mixture of colors are present instead of an even brown color. D for diameter:  Melanomas are usually (but not always) greater than 28mm - the size of a pencil eraser. E for evolution:  The spot keeps changing in size, shape, and color.  Please check your skin once per month between visits. You can use a small mirror in front and a large mirror behind you to keep an eye on the back side or your body.   If you see any new or changing lesions before your next follow-up, please call to schedule a visit.  Please continue daily skin protection including broad spectrum sunscreen SPF 30+ to sun-exposed areas, reapplying every 2 hours as needed when you're outdoors.  Cryotherapy Aftercare  . Wash gently with soap and water everyday.   Marland Kitchen Apply Vaseline and Band-Aid daily until healed.  Prior to procedure, discussed risks of blister formation, small wound, skin dyspigmentation, or rare scar following cryotherapy.   Recommend taking Heliocare sun protection supplement daily in sunny weather for additional sun protection. For maximum  protection on the sunniest days, you can take up to 2 capsules of regular Heliocare OR take 1 capsule of Heliocare Ultra. For prolonged exposure (such as a full day in the sun), you can repeat your dose of the supplement 4 hours after your first dose. Heliocare can be purchased at [facility] or at [email or web address].   Topical steroids (such as triamcinolone, fluocinolone, fluocinonide, mometasone, clobetasol, halobetasol, betamethasone, hydrocortisone) can cause thinning and lightening of the skin if they are used for too long in the same area. Your physician has selected the right strength medicine for your problem and area affected on the body. Please use your medication only as directed by your physician to prevent side effects.   Topical retinoid medications like tretinoin/Retin-A, adapalene/Differin, tazarotene/Fabior, and Epiduo/Epiduo Forte can cause dryness and irritation when first started. Only apply a pea-sized amount to the entire affected area. Avoid applying it around the eyes, edges of mouth and creases at the nose. If you experience irritation, use a good moisturizer first and/or apply the medicine less often. If you are doing well with the medicine, you can increase how often you use it until you are applying every night. Be careful with sun protection while using this medication as it can make you sensitive to the sun. This medicine should not be used by pregnant women.

## 2020-05-09 ENCOUNTER — Ambulatory Visit: Payer: BC Managed Care – PPO | Admitting: Dermatology

## 2020-05-22 ENCOUNTER — Ambulatory Visit: Payer: BC Managed Care – PPO | Admitting: Dermatology

## 2020-07-26 ENCOUNTER — Ambulatory Visit (INDEPENDENT_AMBULATORY_CARE_PROVIDER_SITE_OTHER): Payer: BC Managed Care – PPO | Admitting: Dermatology

## 2020-07-26 ENCOUNTER — Other Ambulatory Visit: Payer: Self-pay

## 2020-07-26 DIAGNOSIS — C44612 Basal cell carcinoma of skin of right upper limb, including shoulder: Secondary | ICD-10-CM | POA: Diagnosis not present

## 2020-07-26 DIAGNOSIS — L988 Other specified disorders of the skin and subcutaneous tissue: Secondary | ICD-10-CM | POA: Diagnosis not present

## 2020-07-26 DIAGNOSIS — D485 Neoplasm of uncertain behavior of skin: Secondary | ICD-10-CM

## 2020-07-26 DIAGNOSIS — L578 Other skin changes due to chronic exposure to nonionizing radiation: Secondary | ICD-10-CM | POA: Diagnosis not present

## 2020-07-26 DIAGNOSIS — C4491 Basal cell carcinoma of skin, unspecified: Secondary | ICD-10-CM

## 2020-07-26 DIAGNOSIS — L304 Erythema intertrigo: Secondary | ICD-10-CM | POA: Diagnosis not present

## 2020-07-26 DIAGNOSIS — L821 Other seborrheic keratosis: Secondary | ICD-10-CM | POA: Diagnosis not present

## 2020-07-26 HISTORY — DX: Basal cell carcinoma of skin, unspecified: C44.91

## 2020-07-26 MED ORDER — TRETINOIN 0.025 % EX CREA: TOPICAL_CREAM | Freq: Every day | CUTANEOUS | 5 refills | Status: AC

## 2020-07-26 MED ORDER — HYDROCORTISONE 2.5 % EX CREA: TOPICAL_CREAM | Freq: Two times a day (BID) | CUTANEOUS | 2 refills | Status: AC | PRN

## 2020-07-26 NOTE — Progress Notes (Signed)
Follow-Up Visit   Subjective  Nicole Chavez but go by Nicole Chavez is a 55 y.o. female who presents for the following: Follow-up (Patient here today for 2 month AK follow up. Patient used 5FU/calcipotriene starting on 2/4 and finishing on 2/10 with very good reaction. Patient has noticed 2 new spots at neck and one at right shoulder that are new. ).   The following portions of the chart were reviewed this encounter and updated as appropriate:   Tobacco  Allergies  Meds  Problems  Med Hx  Surg Hx  Fam Hx      Review of Systems:  No other skin or systemic complaints except as noted in HPI or Assessment and Plan.  Objective  Well appearing patient in no apparent distress; mood and affect are within normal limits.  A focused examination was performed including face, neck, chest and back and shoulders, left leg, arms. Relevant physical exam findings are noted in the Assessment and Plan.  Objective  Right superior shoulder: 0.4cm pink papule with telangiectasia       Objective  Left Upper Arm - Anterior: 0.6cm pink papule   Objective  Left Axilla: Erythematous patch   Assessment & Plan  Neoplasm of uncertain behavior of skin (2) Right superior shoulder  Skin / nail biopsy Type of biopsy: tangential   Informed consent: discussed and consent obtained   Timeout: patient name, date of birth, surgical site, and procedure verified   Patient was prepped and draped in usual sterile fashion: Area prepped with isopropyl alcohol. Anesthesia: the lesion was anesthetized in a standard fashion   Anesthetic:  1% lidocaine w/ epinephrine 1-100,000 buffered w/ 8.4% NaHCO3 Instrument used: flexible razor blade   Hemostasis achieved with: aluminum chloride   Outcome: patient tolerated procedure well   Post-procedure details: wound care instructions given   Additional details:  Mupirocin and a bandage applied  Specimen 1 - Surgical pathology Differential Diagnosis: R/o BCC  Check  Margins: No 0.4cm pink papule with telangiectasia  Left Upper Arm - Anterior  Favor lichenoid keratosis at left upper arm. Patient will observe and call with any changes.   Erythema intertrigo Left Axilla  Start hydrocortisone 2.5% cream twice a day as needed for up to 2 weeks.   Topical steroids (such as triamcinolone, fluocinolone, fluocinonide, mometasone, clobetasol, halobetasol, betamethasone, hydrocortisone) can cause thinning and lightening of the skin if they are used for too long in the same area. Your physician has selected the right strength medicine for your problem and area affected on the body. Please use your medication only as directed by your physician to prevent side effects.    Ordered Medications: hydrocortisone 2.5 % cream  Elastosis of skin Head - Anterior (Face)  Start tretinoin 0.025% cream nightly  Topical retinoid medications like tretinoin/Retin-A, adapalene/Differin, tazarotene/Fabior, and Epiduo/Epiduo Forte can cause dryness and irritation when first started. Only apply a pea-sized amount to the entire affected area. Avoid applying it around the eyes, edges of mouth and creases at the nose. If you experience irritation, use a good moisturizer first and/or apply the medicine less often. If you are doing well with the medicine, you can increase how often you use it until you are applying every night. Be careful with sun protection while using this medication as it can make you sensitive to the sun. This medicine should not be used by pregnant women.    Ordered Medications: tretinoin (RETIN-A) 0.025 % cream  Other Related Medications tretinoin (RETIN-A) 0.05 % cream  Actinic Damage - Severe, chronic, not at goal, secondary to cumulative UV radiation exposure over time - diffuse scaly erythematous macules and papules with underlying dyspigmentation - Discussed Prescription "Field Treatment" for Severe, Chronic Confluent Actinic Changes with Pre-Cancerous  Actinic Keratoses Field treatment involves treatment of an entire area of skin that has confluent Actinic Changes (Sun/ Ultraviolet light damage) and PreCancerous Actinic Keratoses by method of PhotoDynamic Therapy (PDT) and/or prescription Topical Chemotherapy agents such as 5-fluorouracil, 5-fluorouracil/calcipotriene, and/or imiquimod.  The purpose is to decrease the number of clinically evident and subclinical PreCancerous lesions to prevent progression to development of skin cancer by chemically destroying early precancer changes that may or may not be visible.  It has been shown to reduce the risk of developing skin cancer in the treated area. As a result of treatment, redness, scaling, crusting, and open sores may occur during treatment course. One or more than one of these methods may be used and may have to be used several times to control, suppress and eliminate the PreCancerous changes. Discussed treatment course, expected reaction, and possible side effects. - Recommend daily broad spectrum sunscreen SPF 30+ to sun-exposed areas, reapply every 2 hours as needed.  - Staying in the shade or wearing long sleeves, sun glasses (UVA+UVB protection) and wide brim hats (4-inch brim around the entire circumference of the hat) are also recommended. - Call for new or changing lesions. - s/p 5FU/calcipotriene with good reaction - In Fall, Start 5-fluorouracil/calcipotriene cream twice a day for 7 days to affected areas including bilateral arms and hands. Prescription sent to [facility]. Patient advised they will receive an email to purchase the medication online and have it sent to their home. Patient provided with handout reviewing treatment course and side effects and advised to call or message [place] on MyChart with any concerns.   Seborrheic Keratoses - Stuck-on, waxy, tan-brown papules and plaques left ant thigh, neck - Discussed benign etiology and prognosis. - Observe - Call  for any changes  Return in about 8 months (around 03/28/2021) for TBSE, AK follow up.  Graciella Belton, RMA, am acting as scribe for Forest Gleason, MD .  Documentation: I have reviewed the above documentation for accuracy and completeness, and I agree with the above.  Forest Gleason, MD

## 2020-07-26 NOTE — Patient Instructions (Addendum)
If you have any questions or concerns for your doctor, please call our main line at [phone] and press option 4 to reach your doctor's medical assistant. If no one answers, please leave a voicemail as directed and we will return your call as soon as possible. Messages left after 4 pm will be answered the following business day.   You may also send [place] a message via [place]. We typically respond to MyChart messages within 1-2 business days.  For prescription refills, please ask your pharmacy to contact our office. Our fax number is [phone].  If you have an urgent issue when the clinic is closed that cannot wait until the next business day, you can page your doctor at the number below.    Please note that while we do our best to be available for urgent issues outside of office hours, we are not available 24/7.   If you have an urgent issue and are unable to reach [place], you may choose to seek medical care at your doctor's office, retail clinic, urgent care center, or emergency room.  If you have a medical emergency, please immediately call 911 or go to the emergency department.  Pager Numbers  - Dr. Nehemiah Massed: [phone]  - Dr. Laurence Ferrari: [phone]  - Dr. Nicole Kindred: [phone]  In the event of inclement weather, please call our main line at [phone] for an update on the status of any delays or closures.  Dermatology Medication Tips: Please keep the boxes that topical medications come in in order to help keep track of the instructions about where and how to use these. Pharmacies typically print the medication instructions only on the boxes and not directly on the medication tubes.   If your medication is too expensive, please contact our office at [phone] option 4 or send [place] a message through [place].   We are unable to tell what your co-pay for medications will be in advance as this is different depending on your insurance coverage. However, we may be able to find a substitute  medication at lower cost or fill out paperwork to get insurance to cover a needed medication.   If a prior authorization is required to get your medication covered by your insurance company, please allow [place] 1-2 business days to complete this process.  Drug prices often vary depending on where the prescription is filled and some pharmacies may offer cheaper prices.  The website www.goodrx.com contains coupons for medications through different pharmacies. The prices here do not account for what the cost may be with help from insurance (it may be cheaper with your insurance), but the website can give you the price if you did not use any insurance.  - You can print the associated coupon and take it with your prescription to the pharmacy.  - You may also stop by our office during regular business hours and pick up a GoodRx coupon card.  - If you need your prescription sent electronically to a different pharmacy, notify our office through [facility] or by phone at [phone] option 4.   Recommend taking Heliocare sun protection supplement daily in sunny weather for additional sun protection. For maximum protection on the sunniest days, you can take up to 2 capsules of regular Heliocare OR take 1 capsule of Heliocare Ultra. For prolonged exposure (such as a full day in the sun), you can repeat your dose of the supplement 4 hours after your first dose. Heliocare can be purchased at [facility] or at [email or web address].  Wound Care Instructions  1. Cleanse wound gently with soap and water once a day then pat dry with clean gauze. Apply a thing coat of Petrolatum (petroleum jelly, "Vaseline") over the wound (unless you have an allergy to this). We recommend that you use a new, sterile tube of Vaseline. Do not pick or remove scabs. Do not remove the yellow or white "healing tissue" from the base of the wound.  2. Cover the wound with fresh, clean, nonstick gauze and secure with paper  tape. You may use Band-Aids in place of gauze and tape if the would is small enough, but would recommend trimming much of the tape off as there is often too much. Sometimes Band-Aids can irritate the skin.  3. You should call the office for your biopsy report after 1 week if you have not already been contacted.  4. If you experience any problems, such as abnormal amounts of bleeding, swelling, significant bruising, significant pain, or evidence of infection, please call the office immediately.    Some Recommended Sunscreens Include:  Good for Daily Wear (feels like lotion but NOT sweat resistant) Cerave AM Moisturizer with SPF EltaMD UV Lotion  Body or All Over Sunscreen EltaMD UV active for body and face Blue lizard sensitive Sun bum mineral (avoid if sensitive to scent) Aveeno Positively Mineral Neutrogena sheer zinc (Slightly harder to rub in) [facility] clear zinc (Slightly harder to rub in)  Clear Face Sunscreen EltaMD UV Elements CeraVe hydrating sunscreen 50 face  Tinted Face Sunscreen Alastin Hydratint (good for most skin tones, may be slightly dark if you are very fair) Colorescience Sunforgettable Total Protection Face Shield (good for most skin tones) EltaMD UV Physical La Roche Posay Mineral Tinted Cotz Flawless Complexion   Powder Sunscreen (Nice for reapplying or applying on the go) Colorescience Sunforgettable Total Protection Brush on Shield (available in different tints)  Face Sunscreen Available in Different Tints Colorescience Sunforgettable Total Protection Brush on Shield  bareMinerals Complexion Rescue Tinted Hydrating Gel Cream Broad Spectrum SPF 30 UnSun mineral tinted (comes in medium/dark and light/medium)  Kids (over 6 months) - Mineral Sunscreens Recommended eltaMD UV Pure MDsolarSciences KidStick 40 SPF Aveeno Baby Continuous Protection Sensitive Zinc Oxide Blue Lizard Kids mineral based sunscreen lotion Mustela Mineral Sunscreen for face and  body Neutrogena Sheer Zinc Kids Sunscreen Stick  Tinted to look like a tan PCAskin sheer tint body spray    For rash at underarms, use hydrocortisone 2.5% cream twice a day as needed up to 2 weeks.   Topical steroids (such as triamcinolone, fluocinolone, fluocinonide, mometasone, clobetasol, halobetasol, betamethasone, hydrocortisone) can cause thinning and lightening of the skin if they are used for too long in the same area. Your physician has selected the right strength medicine for your problem and area affected on the body. Please use your medication only as directed by your physician to prevent side effects.    Topical retinoid medications like tretinoin/Retin-A, adapalene/Differin, tazarotene/Fabior, and Epiduo/Epiduo Forte can cause dryness and irritation when first started. Only apply a pea-sized amount to the entire affected area. Avoid applying it around the eyes, edges of mouth and creases at the nose. If you experience irritation, use a good moisturizer first and/or apply the medicine less often. If you are doing well with the medicine, you can increase how often you use it until you are applying every night. Be careful with sun protection while using this medication as it can make you sensitive to the sun. This medicine should not be used  by pregnant women.

## 2020-08-06 ENCOUNTER — Encounter: Payer: Self-pay | Admitting: Dermatology

## 2020-09-11 ENCOUNTER — Other Ambulatory Visit: Payer: Self-pay

## 2020-09-11 ENCOUNTER — Encounter: Payer: Self-pay | Admitting: Dermatology

## 2020-09-11 ENCOUNTER — Ambulatory Visit (INDEPENDENT_AMBULATORY_CARE_PROVIDER_SITE_OTHER): Payer: BC Managed Care – PPO | Admitting: Dermatology

## 2020-09-11 DIAGNOSIS — C44612 Basal cell carcinoma of skin of right upper limb, including shoulder: Secondary | ICD-10-CM | POA: Diagnosis not present

## 2020-09-11 MED ORDER — MUPIROCIN 2 % EX OINT: TOPICAL_OINTMENT | CUTANEOUS | 0 refills | Status: DC

## 2020-09-11 NOTE — Patient Instructions (Signed)

## 2020-09-11 NOTE — Progress Notes (Signed)
   Follow-Up Visit   Subjective  Syliva but go by Nicole Chavez is a 55 y.o. female who presents for the following: Follow-up ( Biopsy proven BASAL CELL CARCINOMA, NODULAR PATTERN, right superior shoulder, pt here for treatment ).    The following portions of the chart were reviewed this encounter and updated as appropriate:   Tobacco  Allergies  Meds  Problems  Med Hx  Surg Hx  Fam Hx      Review of Systems:  No other skin or systemic complaints except as noted in HPI or Assessment and Plan.  Objective  Well appearing patient in no apparent distress; mood and affect are within normal limits.  A focused examination was performed including right shoulder . Relevant physical exam findings are noted in the Assessment and Plan.  Objective  Right superior shoulder: Pink pearly papule or plaque with arborizing vessels.    Assessment & Plan  Basal cell carcinoma (BCC) of skin of right upper extremity including shoulder Right superior shoulder  Destruction of lesion Complexity: extensive   Destruction method: electrodesiccation and curettage   Informed consent: discussed and consent obtained   Timeout:  patient name, date of birth, surgical site, and procedure verified Procedure prep:  Patient was prepped and draped in usual sterile fashion Prep type:  Isopropyl alcohol Anesthesia: the lesion was anesthetized in a standard fashion   Anesthetic:  1% lidocaine w/ epinephrine 1-100,000 buffered w/ 8.4% NaHCO3 Curettage performed in three different directions: Yes   Electrodesiccation performed over the curetted area: Yes   Curettage cycles:  3 Final wound size (cm):  1.1 Hemostasis achieved with:  pressure, aluminum chloride and electrodesiccation Outcome: patient tolerated procedure well with no complications   Post-procedure details: sterile dressing applied and wound care instructions given   Dressing type: petrolatum    Start Mupirocin ointment apply to affected area  on the right shoulder bid during each bandage change   Ordered Medications: mupirocin ointment (BACTROBAN) 2 %  Return in about 6 months (around 03/14/2021) for FBSE.  I, Marye Round, CMA, am acting as scribe for Forest Gleason, MD .  Documentation: I have reviewed the above documentation for accuracy and completeness, and I agree with the above.  Forest Gleason, MD

## 2020-09-18 ENCOUNTER — Encounter: Payer: Self-pay | Admitting: Dermatology

## 2020-10-11 ENCOUNTER — Other Ambulatory Visit: Payer: Self-pay | Admitting: Dermatology

## 2020-10-11 DIAGNOSIS — C44612 Basal cell carcinoma of skin of right upper limb, including shoulder: Secondary | ICD-10-CM

## 2021-01-08 ENCOUNTER — Encounter: Payer: Self-pay | Admitting: Dermatology

## 2021-01-08 ENCOUNTER — Ambulatory Visit (INDEPENDENT_AMBULATORY_CARE_PROVIDER_SITE_OTHER): Payer: BC Managed Care – PPO | Admitting: Dermatology

## 2021-01-08 ENCOUNTER — Other Ambulatory Visit: Payer: Self-pay

## 2021-01-08 DIAGNOSIS — L821 Other seborrheic keratosis: Secondary | ICD-10-CM

## 2021-01-08 DIAGNOSIS — B078 Other viral warts: Secondary | ICD-10-CM

## 2021-01-08 DIAGNOSIS — H01116 Allergic dermatitis of left eye, unspecified eyelid: Secondary | ICD-10-CM

## 2021-01-08 DIAGNOSIS — H01113 Allergic dermatitis of right eye, unspecified eyelid: Secondary | ICD-10-CM | POA: Diagnosis not present

## 2021-01-08 DIAGNOSIS — L570 Actinic keratosis: Secondary | ICD-10-CM

## 2021-01-08 DIAGNOSIS — L578 Other skin changes due to chronic exposure to nonionizing radiation: Secondary | ICD-10-CM

## 2021-01-08 DIAGNOSIS — H01119 Allergic dermatitis of unspecified eye, unspecified eyelid: Secondary | ICD-10-CM

## 2021-01-08 MED ORDER — PIMECROLIMUS 1 % EX CREA: TOPICAL_CREAM | Freq: Two times a day (BID) | CUTANEOUS | 0 refills | Status: AC

## 2021-01-08 NOTE — Patient Instructions (Addendum)
5-Fluorouracil/Calcipotriene Patient Education   Actinic keratoses are the dry, red scaly spots on the skin caused by sun damage. A portion of these spots can turn into skin cancer with time, and treating them can help prevent development of skin cancer.   Treatment of these spots requires removal of the defective skin cells. There are various ways to remove actinic keratoses, including freezing with liquid nitrogen, treatment with creams, or treatment with a blue light procedure in the office.   5-fluorouracil cream is a topical cream used to treat actinic keratoses. It works by interfering with the growth of abnormal fast-growing skin cells, such as actinic keratoses. These cells peel off and are replaced by healthy ones.   5-fluorouracil/calcipotriene is a combination of the 5-fluorouracil cream with a vitamin D analog cream called calcipotriene. The calcipotriene alone does not treat actinic keratoses. However, when it is combined with 5-fluorouracil, it helps the 5-fluorouracil treat the actinic keratoses much faster so that the same results can be achieved with a much shorter treatment time.  INSTRUCTIONS FOR 5-FLUOROURACIL/CALCIPOTRIENE CREAM:   5-fluorouracil/calcipotriene cream typically only needs to be used for 4 days to face, 7 days to body for precancers. A thin layer should be applied twice a day to the treatment areas recommended by your physician.   If your physician prescribed you separate tubes of 5-fluourouracil and calcipotriene, apply a thin layer of 5-fluorouracil followed by a thin layer of calcipotriene.   Avoid contact with your eyes, nostrils, and mouth. Do not use 5-fluorouracil/calcipotriene cream on infected or open wounds.   You will develop redness, irritation and some crusting at areas where you have pre-cancer damage/actinic keratoses. IF YOU DEVELOP PAIN, BLEEDING, OR SIGNIFICANT CRUSTING, STOP THE TREATMENT EARLY - you have already gotten a good response and the  actinic keratoses should clear up well.  Wash your hands after applying 5-fluorouracil 5% cream on your skin.   A moisturizer or sunscreen with a minimum SPF 30 should be applied each morning.   Once you have finished the treatment, you can apply a thin layer of Vaseline twice a day to irritated areas to soothe and calm the areas more quickly. If you experience significant discomfort, contact your physician.  For some patients it is necessary to repeat the treatment for best results.  SIDE EFFECTS: When using 5-fluorouracil/calcipotriene cream, you may have mild irritation, such as redness, dryness, swelling, or a mild burning sensation. This usually resolves within 2 weeks. The more actinic keratoses you have, the more redness and inflammation you can expect during treatment. Eye irritation has been reported rarely. If this occurs, please let us know.  If you have any trouble using this cream, please call the office. If you have any other questions about this information, please do not hesitate to ask me before you leave the office.

## 2021-01-08 NOTE — Progress Notes (Signed)
   Follow-Up Visit   Subjective  Nicole Chavez but go by Nicole Chavez is a 55 y.o. female who presents for the following: Skin Problem (Patient here today for several spots at arms, legs and face. Spot at face is scaly, others are rough, raised and have pigmentation changes. Area at left forearm seems to be getting bigger. ).   The following portions of the chart were reviewed this encounter and updated as appropriate:   Tobacco  Allergies  Meds  Problems  Med Hx  Surg Hx  Fam Hx      Review of Systems:  No other skin or systemic complaints except as noted in HPI or Assessment and Plan.  Objective  Well appearing patient in no apparent distress; mood and affect are within normal limits.  A focused examination was performed including arms, legs, face. Relevant physical exam findings are noted in the Assessment and Plan.  Left arm, right lower leg, right upper leg, right upper arm x 3 Verrucous flat papules -- Discussed viral etiology and contagion.   Bilateral eyelids Erythematous patches   Assessment & Plan  Other viral warts Left arm, right lower leg, right upper leg, right upper arm x 3  Many flat warts  Discussed viral etiology and risk of spread.  Discussed multiple treatments may be required to clear warts.  Discussed possible post-treatment dyspigmentation and risk of recurrence.  Start prescription fluorouracil daily for 4-6 weeks to warts (or 5-FU/calcipotriene daily for 2-3 weeks). Take a break if becomes too irritating.  Patient advised to treat in sections, no bigger size then the front of thigh and to wait a few days in between treatment areas.   Eyelid dermatitis, allergic/contact Bilateral eyelids  Start Elidel to eyelids 1-2 times daily as needed. Reviewed risk of burning sensation with first few days of application.  Discussed option of patch testing as this could be due to allergic contact dermatitis. Deferred today.  pimecrolimus (ELIDEL) 1 % cream -  Bilateral eyelids Apply topically 2 (two) times daily. To eyelids as needed  Seborrheic Keratoses - Stuck-on, waxy, tan-brown papules and/or plaques  - Benign-appearing - Discussed benign etiology and prognosis. - Observe - Call for any changes  Actinic Damage - chronic, secondary to cumulative UV radiation exposure/sun exposure over time - diffuse scaly erythematous macules with underlying dyspigmentation - Recommend daily broad spectrum sunscreen SPF 30+ to sun-exposed areas, reapply every 2 hours as needed.  - Recommend staying in the shade or wearing long sleeves, sun glasses (UVA+UVB protection) and wide brim hats (4-inch brim around the entire circumference of the hat). - Call for new or changing lesions. - Patient may opt to re-treat chest with 5-FU/calcipotriene twice a day x 7 days if desired.    Return in about 3 months (around 04/10/2021) for TBSE.  Graciella Belton, RMA, am acting as scribe for Forest Gleason, MD .  Documentation: I have reviewed the above documentation for accuracy and completeness, and I agree with the above.  Forest Gleason, MD

## 2021-02-05 ENCOUNTER — Other Ambulatory Visit: Payer: Self-pay | Admitting: Family Medicine

## 2021-02-05 DIAGNOSIS — Z1231 Encounter for screening mammogram for malignant neoplasm of breast: Secondary | ICD-10-CM

## 2021-02-13 ENCOUNTER — Other Ambulatory Visit (HOSPITAL_COMMUNITY): Payer: Self-pay | Admitting: Family Medicine

## 2021-02-19 ENCOUNTER — Other Ambulatory Visit: Payer: Self-pay

## 2021-02-19 ENCOUNTER — Ambulatory Visit
Admission: RE | Admit: 2021-02-19 | Discharge: 2021-02-19 | Disposition: A | Discharge status: Home / Self Care | Payer: BC Managed Care – PPO | Source: Ambulatory Visit | Attending: Family Medicine | Admitting: Family Medicine

## 2021-02-19 DIAGNOSIS — Z1231 Encounter for screening mammogram for malignant neoplasm of breast: Secondary | ICD-10-CM | POA: Insufficient documentation

## 2021-02-22 ENCOUNTER — Ambulatory Visit: Payer: BC Managed Care – PPO

## 2021-02-22 ENCOUNTER — Other Ambulatory Visit: Payer: Self-pay

## 2021-02-22 ENCOUNTER — Ambulatory Visit
Admission: RE | Admit: 2021-02-22 | Discharge: 2021-02-22 | Disposition: A | Discharge status: Home / Self Care | Payer: BC Managed Care – PPO | Source: Ambulatory Visit | Attending: Family Medicine | Admitting: Family Medicine

## 2021-02-22 DIAGNOSIS — E7800 Pure hypercholesterolemia, unspecified: Secondary | ICD-10-CM | POA: Insufficient documentation

## 2021-02-22 DIAGNOSIS — I700 Atherosclerosis of aorta: Secondary | ICD-10-CM | POA: Insufficient documentation

## 2021-03-14 ENCOUNTER — Ambulatory Visit: Payer: BC Managed Care – PPO | Admitting: Dermatology

## 2021-03-20 IMAGING — MG DIGITAL SCREENING BILAT W/ TOMO W/ CAD
8 series · 8 of 24 positions shown · non-contrast
Comparison: Previous exam(s).

CLINICAL DATA: Screening.

EXAM:
DIGITAL SCREENING BILATERAL MAMMOGRAM WITH TOMO AND CAD

[L MLO synth-2D]
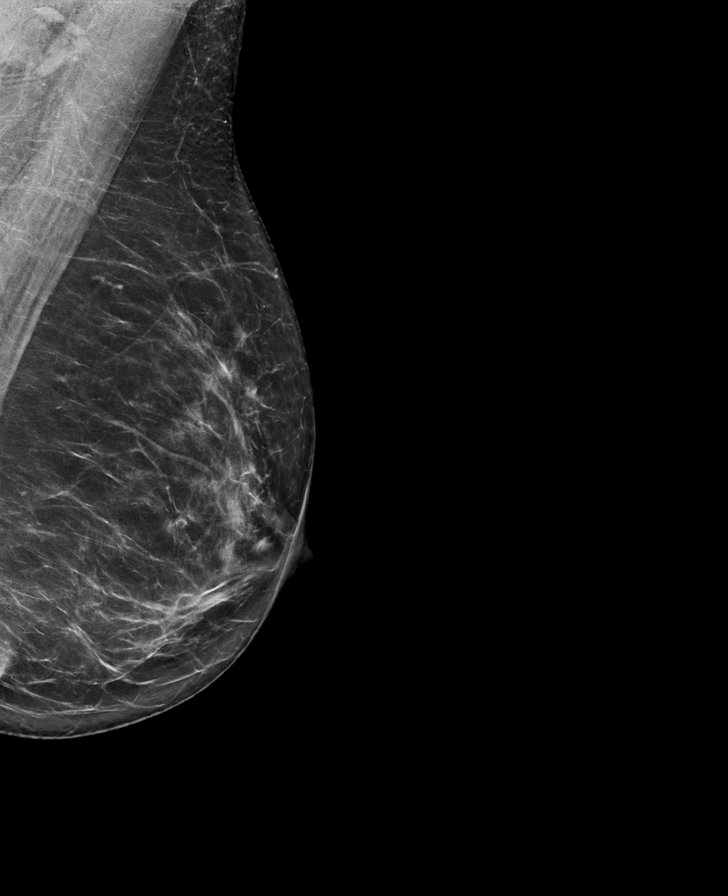

[L CC synth-2D]
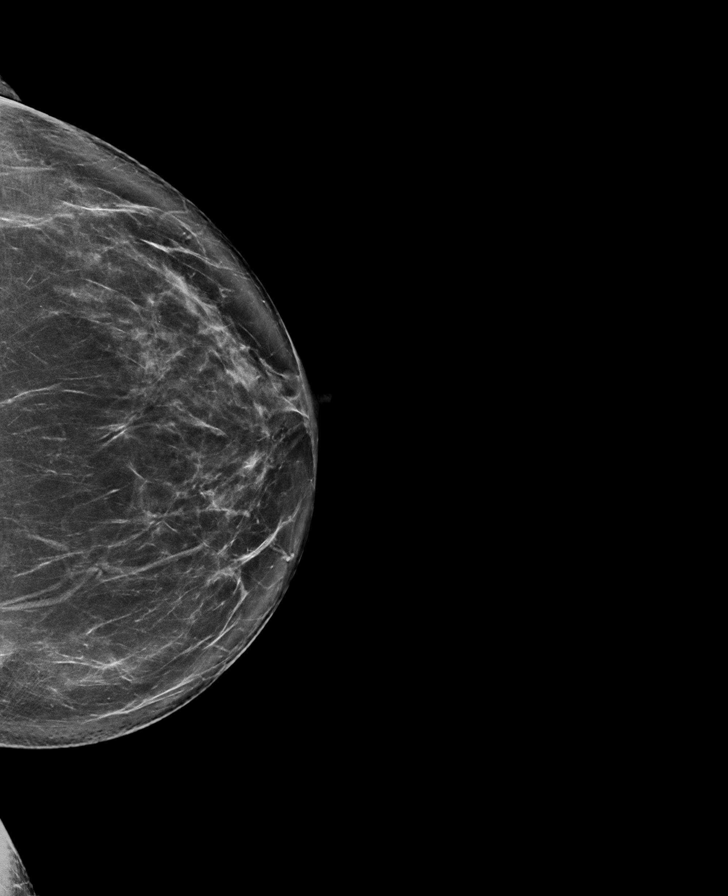

[R MLO synth-2D]
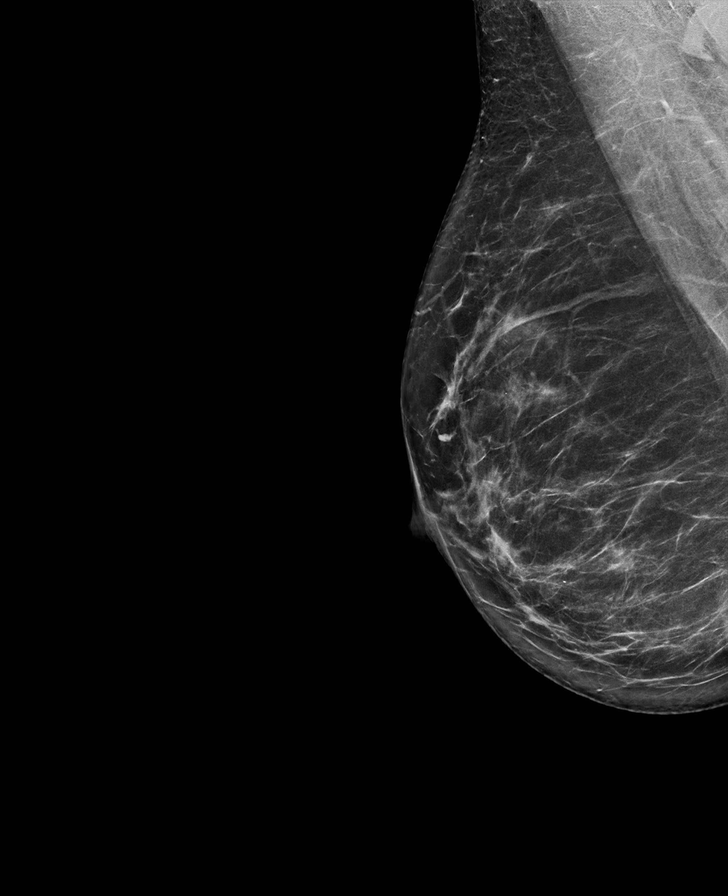

[R CC synth-2D]
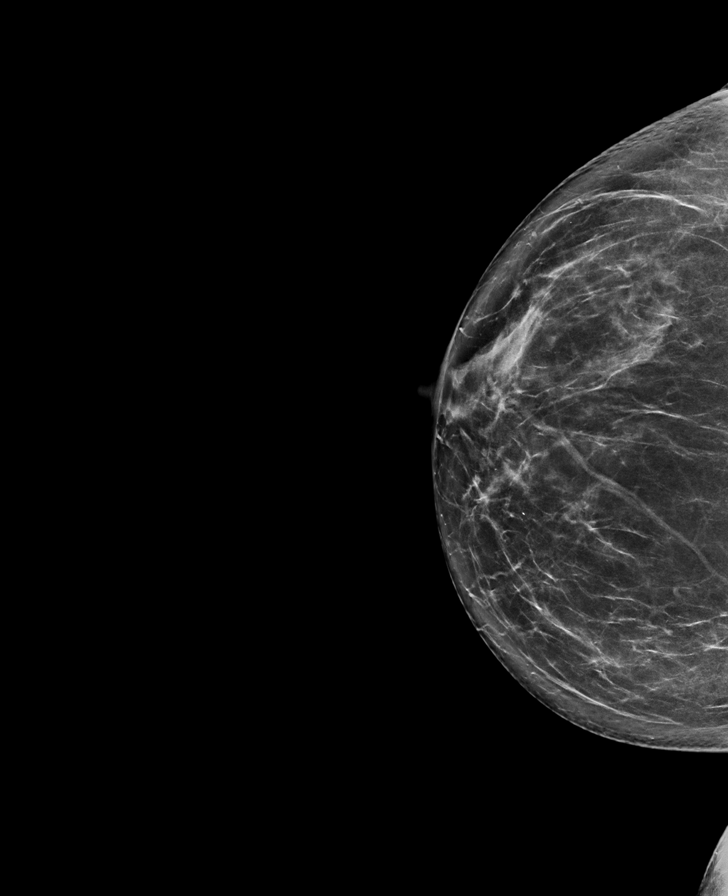

[L MLO tomo · tomo slice 39/76.0]
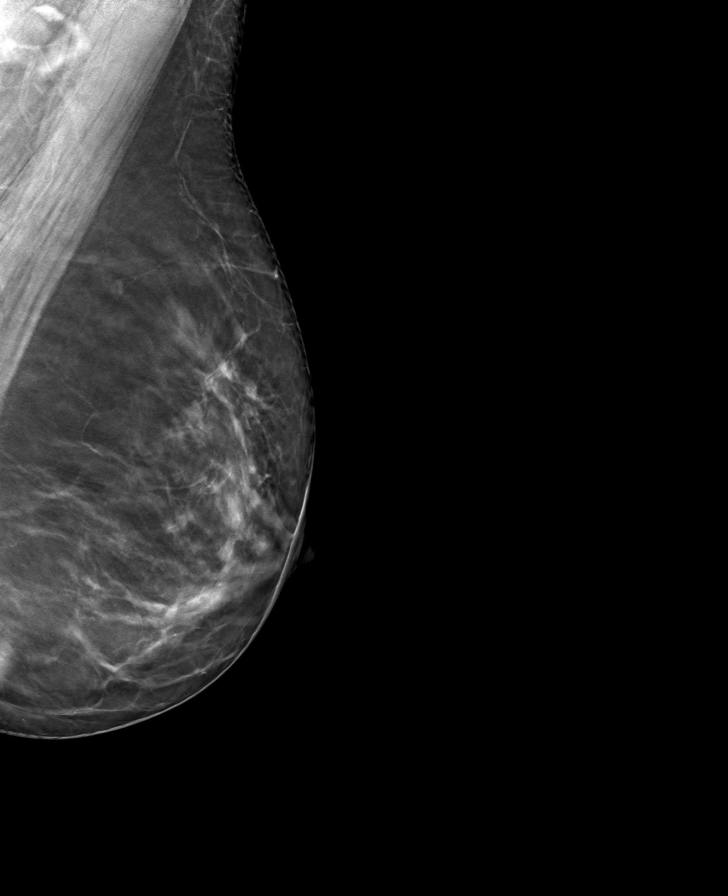

[R CC tomo · tomo slice 41/81.0]
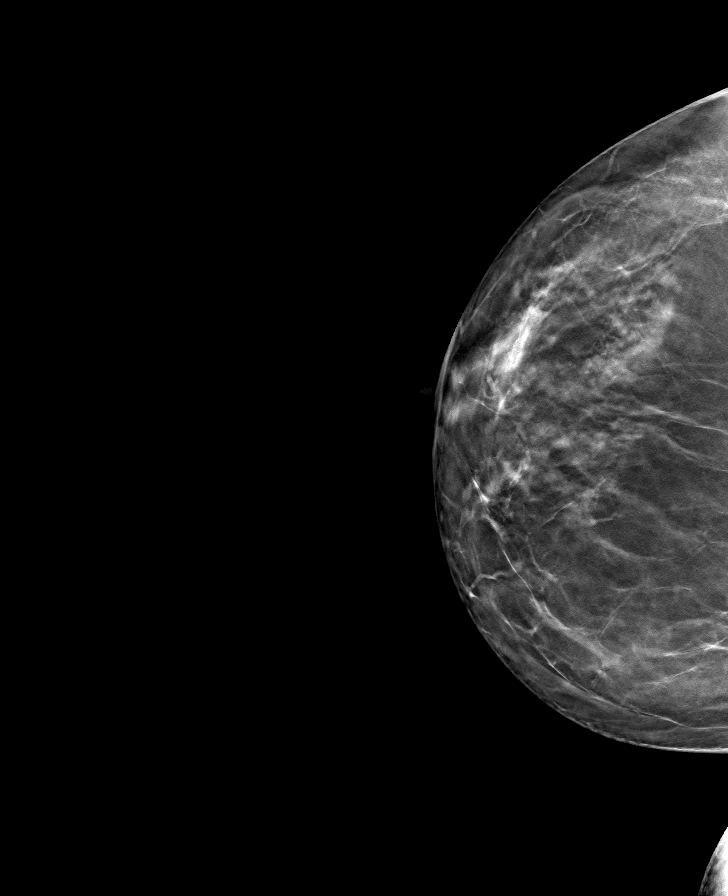

[R MLO tomo · tomo slice 40/79.0]
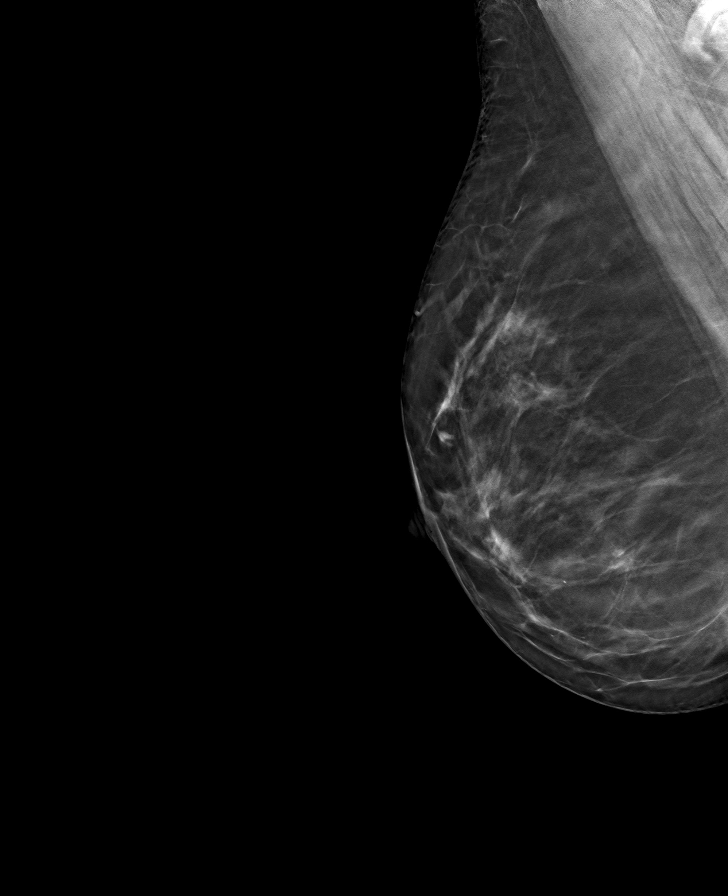

[L CC tomo · tomo slice 43/85.0]
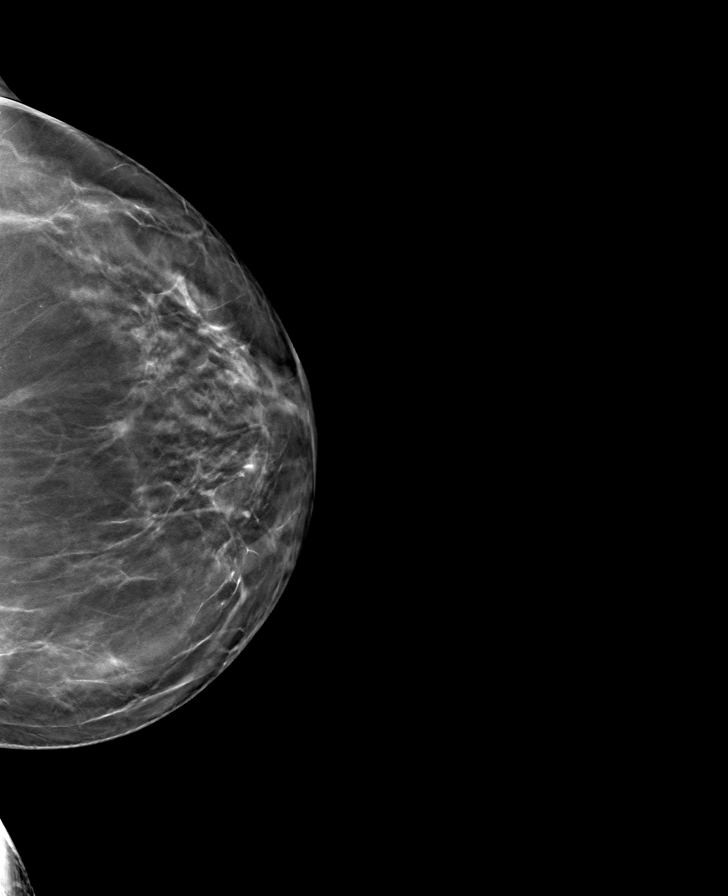

[8 of 24 positions shown; findings below may reference images not displayed]

ACR Breast Density Category b: There are scattered areas of
fibroglandular density.
FINDINGS: There are no findings suspicious for malignancy. Images were
processed with CAD.
IMPRESSION: No mammographic evidence of malignancy. A result letter of this
screening mammogram will be mailed directly to the patient.

RECOMMENDATION:
Screening mammogram in one year. (Code:[id number])

BI-RADS CATEGORY  1: Negative.

## 2021-06-05 ENCOUNTER — Other Ambulatory Visit: Payer: Self-pay

## 2021-06-05 ENCOUNTER — Ambulatory Visit (INDEPENDENT_AMBULATORY_CARE_PROVIDER_SITE_OTHER): Payer: BC Managed Care – PPO | Admitting: Dermatology

## 2021-06-05 DIAGNOSIS — L821 Other seborrheic keratosis: Secondary | ICD-10-CM

## 2021-06-05 DIAGNOSIS — Z1283 Encounter for screening for malignant neoplasm of skin: Secondary | ICD-10-CM | POA: Diagnosis not present

## 2021-06-05 DIAGNOSIS — Z872 Personal history of diseases of the skin and subcutaneous tissue: Secondary | ICD-10-CM

## 2021-06-05 DIAGNOSIS — L578 Other skin changes due to chronic exposure to nonionizing radiation: Secondary | ICD-10-CM

## 2021-06-05 DIAGNOSIS — D225 Melanocytic nevi of trunk: Secondary | ICD-10-CM

## 2021-06-05 DIAGNOSIS — D1800 Hemangioma unspecified site: Secondary | ICD-10-CM

## 2021-06-05 DIAGNOSIS — D229 Melanocytic nevi, unspecified: Secondary | ICD-10-CM

## 2021-06-05 DIAGNOSIS — L601 Onycholysis: Secondary | ICD-10-CM

## 2021-06-05 DIAGNOSIS — Z85828 Personal history of other malignant neoplasm of skin: Secondary | ICD-10-CM

## 2021-06-05 DIAGNOSIS — L814 Other melanin hyperpigmentation: Secondary | ICD-10-CM

## 2021-06-05 MED ORDER — CICLOPIROX OLAMINE 0.77 % EX SUSP: CUTANEOUS | 1 refills | Status: AC

## 2021-06-05 NOTE — Progress Notes (Signed)
Follow-Up Visit   Subjective  Nicole Chavez but go by Nicole Chavez is a 56 y.o. female who presents for the following: FBSE (Patient here for full body skin exam and skin cancer screening. Patient with hx of BCC. Patient advises she does have a spot at foot she circled and a freckle at right labia she would like checked.).  Patient treated chest with 5FU/calcipotriene about a month ago, thighs in October 2022, spot at right cheek and just started treating right arm.   Patient also would like thumb nail checked for fungus. She does get nails done but brings her own tools.   The following portions of the chart were reviewed this encounter and updated as appropriate:   Tobacco   Allergies   Meds   Problems   Med Hx   Surg Hx   Fam Hx       Review of Systems:  No other skin or systemic complaints except as noted in HPI or Assessment and Plan.  Objective  Well appearing patient in no apparent distress; mood and affect are within normal limits.  A full examination was performed including scalp, head, eyes, ears, nose, lips, neck, chest, axillae, abdomen, back, buttocks, bilateral upper extremities, bilateral lower extremities, hands, feet, fingers, toes, fingernails, and toenails. All findings within normal limits unless otherwise noted below.  Left Forearm Flat pink waxy papule  right labia 0.3 cm medium dark brown macule  Right Thumb Nail Discoloration with separation at right thumb nail.         Assessment & Plan  Seborrheic keratosis Left Forearm  Vs flat wart. Discussed possible viral nature and risk of spread.  Patient will treat with 5FU/calcipotriene at home when she treats entire arm  Nevus right labia  Benign-appearing.  Observation.  Call clinic for new or changing lesions.  Recommend daily use of broad spectrum spf 30+ sunscreen to sun-exposed areas.    Onycholysis Right Thumb Nail  Possibly secondary to onychomycosis vs trauma  Clip nail short and start  Ciclopirox solution daily to underside and top of nail as directed  If not improving in 6 weeks, RTC to consider nail clipping  ciclopirox (LOPROX) 0.77 % SUSP - Right Thumb Nail Apply to affected nail once daily.   Lentigines - Scattered tan macules - Due to sun exposure - Benign-appearing, observe - Recommend daily broad spectrum sunscreen SPF 30+ to sun-exposed areas, reapply every 2 hours as needed. - Call for any changes  Seborrheic Keratoses - Stuck-on, waxy, tan-brown papules and/or plaques  - Benign-appearing - Discussed benign etiology and prognosis. - Observe - Call for any changes  Melanocytic Nevi - Tan-brown and/or pink-flesh-colored symmetric macules and papules - Benign appearing on exam today - Observation - Call clinic for new or changing moles - Recommend daily use of broad spectrum spf 30+ sunscreen to sun-exposed areas.   Hemangiomas - Red papules - Discussed benign nature - Observe - Call for any changes  Actinic Damage - Severe, confluent actinic changes with pre-cancerous actinic keratoses  - Severe, chronic, not at goal, secondary to cumulative UV radiation exposure over time. Improved at chest, legs, right arm s/p treatment with 5FU/calcipotriene - diffuse scaly erythematous macules and papules with underlying dyspigmentation - Discussed Prescription "Field Treatment" for Severe, Chronic Confluent Actinic Changes with Pre-Cancerous Actinic Keratoses Field treatment involves treatment of an entire area of skin that has confluent Actinic Changes (Sun/ Ultraviolet light damage) and PreCancerous Actinic Keratoses by method of PhotoDynamic Therapy (PDT) and/or prescription  Topical Chemotherapy agents such as 5-fluorouracil, 5-fluorouracil/calcipotriene, and/or imiquimod.  The purpose is to decrease the number of clinically evident and subclinical PreCancerous lesions to prevent progression to development of skin cancer by chemically destroying early  precancer changes that may or may not be visible.  It has been shown to reduce the risk of developing skin cancer in the treated area. As a result of treatment, redness, scaling, crusting, and open sores may occur during treatment course. One or more than one of these methods may be used and may have to be used several times to control, suppress and eliminate the PreCancerous changes. Discussed treatment course, expected reaction, and possible side effects. - Recommend daily broad spectrum sunscreen SPF 30+ to sun-exposed areas, reapply every 2 hours as needed.  - Staying in the shade or wearing long sleeves, sun glasses (UVA+UVB protection) and wide brim hats (4-inch brim around the entire circumference of the hat) are also recommended. - Call for new or changing lesions. - treat face twice a day x 4 days with rx 5-fluorouracil/calcipotriene and left arm and hand twice a day x 7 days  History of Basal Cell Carcinoma of the Skin - No evidence of recurrence today - Recommend regular full body skin exams - Recommend daily broad spectrum sunscreen SPF 30+ to sun-exposed areas, reapply every 2 hours as needed.  - Call if any new or changing lesions are noted between office visits  History of PreCancerous Actinic Keratosis  - site(s) of PreCancerous Actinic Keratosis clear today. - these may recur and new lesions may form requiring treatment to prevent transformation into skin cancer - observe for new or changing spots and contact [name] for appointment if occur - photoprotection with sun protective clothing; sunglasses and broad spectrum sunscreen with SPF of at least 30 + and frequent self skin exams recommended - yearly exams by a dermatologist recommended for persons with history of PreCancerous Actinic Keratoses  Skin cancer screening performed today.  Return for TBSE, actinic damage  6-12 months.  Graciella Belton, RMA, am acting as scribe for Forest Gleason, MD  .  Documentation: I have reviewed the above documentation for accuracy and completeness, and I agree with the above.  Forest Gleason, MD

## 2021-06-05 NOTE — Patient Instructions (Addendum)
Start 5-fluorouracil/calcipotriene cream twice a day for 4 days to affected areas including face, for 7 days to areas at body. Patient provided with handout reviewing treatment course and side effects and advised to call or message [place] on MyChart with any concerns.     Melanoma ABCDEs  Melanoma is the most dangerous type of skin cancer, and is the leading cause of death from skin disease.  You are more likely to develop melanoma if you: Have light-colored skin, light-colored eyes, or red or blond hair Spend a lot of time in the sun Tan regularly, either outdoors or in a tanning bed Have had blistering sunburns, especially during childhood Have a close family member who has had a melanoma Have atypical moles or large birthmarks  Early detection of melanoma is key since treatment is typically straightforward and cure rates are extremely high if we catch it early.   The first sign of melanoma is often a change in a mole or a new dark spot.  The ABCDE system is a way of remembering the signs of melanoma.  A for asymmetry:  The two halves do not match. B for border:  The edges of the growth are irregular. C for color:  A mixture of colors are present instead of an even brown color. D for diameter:  Melanomas are usually (but not always) greater than 48mm - the size of a pencil eraser. E for evolution:  The spot keeps changing in size, shape, and color.  Please check your skin once per month between visits. You can use a small mirror in front and a large mirror behind you to keep an eye on the back side or your body.   If you see any new or changing lesions before your next follow-up, please call to schedule a visit.  Please continue daily skin protection including broad spectrum sunscreen SPF 30+ to sun-exposed areas, reapplying every 2 hours as needed when you're outdoors.    Recommend taking Heliocare sun protection supplement daily in sunny weather for additional sun protection. For  maximum protection on the sunniest days, you can take up to 2 capsules of regular Heliocare OR take 1 capsule of Heliocare Ultra. For prolonged exposure (such as a full day in the sun), you can repeat your dose of the supplement 4 hours after your first dose. Heliocare can be purchased at [employer], at some [facility] or at [email or web address].    Recommend daily broad spectrum sunscreen SPF 30+ to sun-exposed areas, reapply every 2 hours as needed. Call for new or changing lesions.  Staying in the shade or wearing long sleeves, sun glasses (UVA+UVB protection) and wide brim hats (4-inch brim around the entire circumference of the hat) are also recommended for sun protection.    If You Need Anything After Your Visit  If you have any questions or concerns for your doctor, please call our main line at [phone] and press option 4 to reach your doctor's medical assistant. If no one answers, please leave a voicemail as directed and we will return your call as soon as possible. Messages left after 4 pm will be answered the following business day.   You may also send [place] a message via [place]. We typically respond to MyChart messages within 1-2 business days.  For prescription refills, please ask your pharmacy to contact our office. Our fax number is [phone].  If you have an urgent issue when the clinic is closed that cannot wait until the next business day,  you can page your doctor at the number below.    Please note that while we do our best to be available for urgent issues outside of office hours, we are not available 24/7.   If you have an urgent issue and are unable to reach [place], you may choose to seek medical care at your doctor's office, retail clinic, urgent care center, or emergency room.  If you have a medical emergency, please immediately call 911 or go to the emergency department.  Pager Numbers  - Dr. Nehemiah Massed: [phone]  - Dr. Laurence Ferrari: [phone]  - Dr. Nicole Kindred:  [phone]  In the event of inclement weather, please call our main line at [phone] for an update on the status of any delays or closures.  Dermatology Medication Tips: Please keep the boxes that topical medications come in in order to help keep track of the instructions about where and how to use these. Pharmacies typically print the medication instructions only on the boxes and not directly on the medication tubes.   If your medication is too expensive, please contact our office at [phone] option 4 or send [place] a message through [place].   We are unable to tell what your co-pay for medications will be in advance as this is different depending on your insurance coverage. However, we may be able to find a substitute medication at lower cost or fill out paperwork to get insurance to cover a needed medication.   If a prior authorization is required to get your medication covered by your insurance company, please allow [place] 1-2 business days to complete this process.  Drug prices often vary depending on where the prescription is filled and some pharmacies may offer cheaper prices.  The website www.goodrx.com contains coupons for medications through different pharmacies. The prices here do not account for what the cost may be with help from insurance (it may be cheaper with your insurance), but the website can give you the price if you did not use any insurance.  - You can print the associated coupon and take it with your prescription to the pharmacy.  - You may also stop by our office during regular business hours and pick up a GoodRx coupon card.  - If you need your prescription sent electronically to a different pharmacy, notify our office through [facility] or by phone at [phone] option 4.     Si Usted Necesita Algo Despus de Su Visita  Tambin puede enviarnos un mensaje a travs de Pharmacist, community. Por lo general respondemos a los mensajes de MyChart en el transcurso de 1 a 2  das hbiles.  Para renovar recetas, por favor pida a su farmacia que se ponga en contacto con nuestra oficina. Harland Dingwall de fax es [place] [phone].  Si tiene un asunto urgente cuando la clnica est cerrada y que no puede esperar hasta el siguiente da hbil, puede llamar/localizar a su doctor(a) al nmero que aparece a continuacin.   Por favor, tenga en cuenta que aunque hacemos todo lo posible para estar disponibles para asuntos urgentes fuera del horario de [place], no estamos disponibles las 24 horas del da, los 7 das de la [place].   Si tiene un problema urgente y no puede comunicarse con nosotros, puede optar por buscar atencin mdica  en el consultorio de su doctor(a), en una clnica privada, en un centro de atencin urgente o en una sala de emergencias.  Si tiene una emergencia mdica, por favor llame inmediatamente al 911 o vaya a  la sala de emergencias.  Nmeros de bper  - Dr. Nehemiah Massed: [phone]  - Dra. Moye: [phone]  - Dra. Nicole Kindred: [phone]  En caso de inclemencias del [place], por favor llame a Johnsie Kindred principal al [phone] para una actualizacin sobre el [place] de cualquier retraso o cierre.  Consejos para la medicacin en dermatologa: Por favor, guarde las cajas en las que vienen los medicamentos de uso tpico para ayudarle a seguir las instrucciones sobre dnde y cmo usarlos. Las farmacias generalmente imprimen las instrucciones del medicamento slo en las cajas y no directamente en los tubos del [place].   Si su medicamento es muy caro, por favor, pngase en contacto con Zigmund Daniel llamando al [phone] y presione la opcin 4 o envenos un mensaje a travs de Pharmacist, community.   No podemos decirle cul ser su copago por los medicamentos por adelantado ya que esto es diferente dependiendo de la cobertura de su seguro. Sin embargo, es posible que podamos encontrar un medicamento sustituto a Electrical engineer un formulario para que el  seguro cubra el medicamento que se considera necesario.   Si se requiere una autorizacin previa para que su compaa de seguros [place] su medicamento, por favor permtanos de 1 a 2 das hbiles para completar este proceso.  Los precios de los medicamentos varan con frecuencia dependiendo del Environmental consultant de dnde se surte la receta y alguna farmacias pueden ofrecer precios ms baratos.  El sitio web www.goodrx.com tiene cupones para medicamentos de Airline pilot. Los precios aqu no tienen en cuenta lo que podra costar con la ayuda del seguro (puede ser ms barato con su seguro), pero el sitio web puede darle el precio si no utiliz Research scientist (physical sciences).  - Puede imprimir el cupn correspondiente y llevarlo con su receta a la farmacia.  - Tambin puede pasar por nuestra oficina durante el horario de atencin regular y Charity fundraiser una tarjeta de cupones de GoodRx.  - Si necesita que su receta se enve electrnicamente a una farmacia diferente, informe a nuestra oficina a travs de MyChart de [place] o por telfono llamando al [phone] y presione la opcin 4.

## 2021-06-07 ENCOUNTER — Encounter: Payer: Self-pay | Admitting: Dermatology

## 2021-07-17 ENCOUNTER — Ambulatory Visit: Payer: BC Managed Care – PPO | Admitting: Dermatology

## 2021-12-04 ENCOUNTER — Ambulatory Visit: Payer: BC Managed Care – PPO | Admitting: Dermatology

## 2022-01-22 ENCOUNTER — Ambulatory Visit: Payer: BC Managed Care – PPO | Admitting: Dermatology

## 2022-02-13 ENCOUNTER — Ambulatory Visit: Payer: BC Managed Care – PPO | Admitting: Dermatology

## 2022-02-24 ENCOUNTER — Other Ambulatory Visit: Payer: Self-pay | Admitting: Family Medicine

## 2022-02-24 DIAGNOSIS — Z1231 Encounter for screening mammogram for malignant neoplasm of breast: Secondary | ICD-10-CM

## 2022-02-25 ENCOUNTER — Encounter: Payer: Self-pay | Admitting: Dermatology

## 2022-02-25 ENCOUNTER — Ambulatory Visit (INDEPENDENT_AMBULATORY_CARE_PROVIDER_SITE_OTHER): Payer: BC Managed Care – PPO | Admitting: Dermatology

## 2022-02-25 DIAGNOSIS — K130 Diseases of lips: Secondary | ICD-10-CM | POA: Diagnosis not present

## 2022-02-25 DIAGNOSIS — Z1283 Encounter for screening for malignant neoplasm of skin: Secondary | ICD-10-CM

## 2022-02-25 DIAGNOSIS — L578 Other skin changes due to chronic exposure to nonionizing radiation: Secondary | ICD-10-CM

## 2022-02-25 DIAGNOSIS — Z85828 Personal history of other malignant neoplasm of skin: Secondary | ICD-10-CM

## 2022-02-25 DIAGNOSIS — L905 Scar conditions and fibrosis of skin: Secondary | ICD-10-CM

## 2022-02-25 DIAGNOSIS — L570 Actinic keratosis: Secondary | ICD-10-CM | POA: Diagnosis not present

## 2022-02-25 DIAGNOSIS — L818 Other specified disorders of pigmentation: Secondary | ICD-10-CM | POA: Diagnosis not present

## 2022-02-25 DIAGNOSIS — L821 Other seborrheic keratosis: Secondary | ICD-10-CM

## 2022-02-25 DIAGNOSIS — D220 Melanocytic nevi of lip: Secondary | ICD-10-CM

## 2022-02-25 DIAGNOSIS — D229 Melanocytic nevi, unspecified: Secondary | ICD-10-CM

## 2022-02-25 DIAGNOSIS — B070 Plantar wart: Secondary | ICD-10-CM

## 2022-02-25 DIAGNOSIS — D225 Melanocytic nevi of trunk: Secondary | ICD-10-CM

## 2022-02-25 DIAGNOSIS — L814 Other melanin hyperpigmentation: Secondary | ICD-10-CM

## 2022-02-25 DIAGNOSIS — D2361 Other benign neoplasm of skin of right upper limb, including shoulder: Secondary | ICD-10-CM

## 2022-02-25 MED ORDER — MUPIROCIN 2 % EX OINT: TOPICAL_OINTMENT | CUTANEOUS | 1 refills | Status: DC

## 2022-02-25 MED ORDER — KETOCONAZOLE 2 % EX CREA: TOPICAL_CREAM | CUTANEOUS | 1 refills | Status: AC

## 2022-02-25 MED ORDER — HYDROCORTISONE 2.5 % EX CREA: TOPICAL_CREAM | CUTANEOUS | 1 refills | Status: AC

## 2022-02-25 NOTE — Progress Notes (Signed)
Follow-Up Visit   Subjective  Le but go by Nicole Chavez is a 56 y.o. female who presents for the following: Annual Exam (HxBCC, HxAKs. Purple spot on lower lip 6-8 weeks).  The patient presents for Total-Body Skin Exam (TBSE) for skin cancer screening and mole check.  The patient has spots, moles and lesions to be evaluated, some may be new or changing and the patient has concerns that these could be cancer.   The following portions of the chart were reviewed this encounter and updated as appropriate:  Tobacco  Allergies  Meds  Problems  Med Hx  Surg Hx  Fam Hx      Review of Systems: No other skin or systemic complaints except as noted in HPI or Assessment and Plan.   Objective  Well appearing patient in no apparent distress; mood and affect are within normal limits.  A full examination was performed including scalp, head, eyes, ears, nose, lips, neck, chest, axillae, abdomen, back, buttocks, bilateral upper extremities, bilateral lower extremities, hands, feet, fingers, toes, fingernails, and toenails. All findings within normal limits unless otherwise noted below.  Mid Lower Vermilion Lip Pigmented macule  Lips Diffuse scaly erythematous macules with underlying dyspigmentation.   Right Alar Crease Dyspigmented smooth macule or patch without features suspicious for malignancy on dermoscopy   right upper arm (hypertrophic) Erythematous thin papules/macules with gritty scale.   feet Verrucous papules -- Discussed viral etiology and contagion.   right labia majora 0.3 cm medium dark brown macule   Assessment & Plan   History of Basal Cell Carcinoma of the Skin. Right superior shoulder. EDC. 09/11/2020 - No evidence of recurrence today - Recommend regular full body skin exams - Recommend daily broad spectrum sunscreen SPF 30+ to sun-exposed areas, reapply every 2 hours as needed.  - Call if any new or changing lesions are noted between office  visits  Lentigines - Scattered tan macules - Due to sun exposure - Benign-appearing, observe - Recommend daily broad spectrum sunscreen SPF 30+ to sun-exposed areas, reapply every 2 hours as needed. - Call for any changes  Seborrheic Keratoses - Stuck-on, waxy, tan-brown papules and/or plaques  - Benign-appearing - Discussed benign etiology and prognosis. - Observe - Call for any changes  Melanocytic Nevi - Tan-brown and/or pink-flesh-colored symmetric macules and papules - Benign appearing on exam today - Observation - Call clinic for new or changing moles - Recommend daily use of broad spectrum spf 30+ sunscreen to sun-exposed areas.   Hemangiomas - Red papules - Discussed benign nature - Observe - Call for any changes  Actinic Damage - Severe, confluent actinic changes with pre-cancerous actinic keratoses  - Severe, chronic, not at goal, secondary to cumulative UV radiation exposure over time - diffuse scaly erythematous macules and papules with underlying dyspigmentation - Discussed Prescription "Field Treatment" for Severe, Chronic Confluent Actinic Changes with Pre-Cancerous Actinic Keratoses Field treatment involves treatment of an entire area of skin that has confluent Actinic Changes (Sun/ Ultraviolet light damage) and PreCancerous Actinic Keratoses by method of PhotoDynamic Therapy (PDT) and/or prescription Topical Chemotherapy agents such as 5-fluorouracil, 5-fluorouracil/calcipotriene, and/or imiquimod.  The purpose is to decrease the number of clinically evident and subclinical PreCancerous lesions to prevent progression to development of skin cancer by chemically destroying early precancer changes that may or may not be visible.  It has been shown to reduce the risk of developing skin cancer in the treated area. As a result of treatment, redness, scaling, crusting, and open sores  may occur during treatment course. One or more than one of these methods may be used and  may have to be used several times to control, suppress and eliminate the PreCancerous changes. Discussed treatment course, expected reaction, and possible side effects. - Recommend daily broad spectrum sunscreen SPF 30+ to sun-exposed areas, reapply every 2 hours as needed.  - Staying in the shade or wearing long sleeves, sun glasses (UVA+UVB protection) and wide brim hats (4-inch brim around the entire circumference of the hat) are also recommended. - Call for new or changing lesions.  - Start 5-fluorouracil/calcipotriene cream twice a day for 7 days to affected areas including right forearm, neck, chest. Prescription sent to [facility]. Patient advised they will receive an email to purchase the medication online and have it sent to their home. Patient provided with handout reviewing treatment course and side effects and advised to call or message [place] on MyChart with any concerns.  Reviewed course of treatment and expected reaction.  Patient advised to expect inflammation and crusting and advised that erosions are possible.  Patient advised to be diligent with sun protection during and after treatment. Counseled to keep medication out of reach of children and pets.   Skin cancer screening performed today.  Dermatofibroma. Right posterior shoulder.  - Firm pink/brown papulenodule with dimple sign - Benign appearing - Call for any changes   Melanotic macule of lip Mid Lower Vermilion Lip  Benign-appearing.  Observation.  Call clinic for new or changing lesions.    Recommend wearing lip balm with sunscreen.  Cheilitis Lips  With Angular Cheilitis   Ketoconazole 2% cream twice daily Mupirocin twice daily for 2 weeks Hydrocortisone  2.5% cream twice daily up to 2 weeks  ketoconazole (NIZORAL) 2 % cream - Lips Apply twice daily to lips for 2 weeks  hydrocortisone 2.5 % cream - Lips Apply twice daily to lips up to 2 weeks  mupirocin ointment (BACTROBAN) 2 % -  Lips Apply twice daily to lips for 2 weeks  Scar Right Alar Crease  Benign-appearing.  Recommend observation for change.  Call clinic for appointment for evaluation of any new or changing lesions.     AK (actinic keratosis) right upper arm (hypertrophic)  Actinic keratoses are precancerous spots that appear secondary to cumulative UV radiation exposure/sun exposure over time. They are chronic with expected duration over 1 year. A portion of actinic keratoses will progress to squamous cell carcinoma of the skin. It is not possible to reliably predict which spots will progress to skin cancer and so treatment is recommended to prevent development of skin cancer.  Recommend daily broad spectrum sunscreen SPF 30+ to sun-exposed areas, reapply every 2 hours as needed.  Recommend staying in the shade or wearing long sleeves, sun glasses (UVA+UVB protection) and wide brim hats (4-inch brim around the entire circumference of the hat). Call for new or changing lesions.  Destruction of lesion - right upper arm (hypertrophic)  Destruction method: cryotherapy   Informed consent: discussed and consent obtained   Lesion destroyed using liquid nitrogen: Yes   Cryotherapy cycles:  2 Outcome: patient tolerated procedure well with no complications   Post-procedure details: wound care instructions given   Additional details:  Prior to procedure, discussed risks of blister formation, small wound, skin dyspigmentation, or rare scar following cryotherapy. Recommend Vaseline ointment to treated areas while healing.   Plantar wart feet  Discussed viral etiology and risk of spread.  Discussed multiple treatments may be required to  clear warts.  Discussed possible post-treatment dyspigmentation and risk of recurrence.  Use Fluorouracil 5% cream with OTC Salicylic acid to affected areas at bedtime to warts  Nevus right labia majora  Benign-appearing. Stable compared to previous visit. Observation.  Call  clinic for new or changing moles.  Recommend daily use of broad spectrum spf 30+ sunscreen to sun-exposed areas.     Return in about 6 months (around 08/27/2022) for TBSE, HxBCC, AK Follow Up.  I, Emelia Salisbury, CMA, am acting as scribe for Forest Gleason, MD.   Documentation: I have reviewed the above documentation for accuracy and completeness, and I agree with the above.  Forest Gleason, MD

## 2022-02-25 NOTE — Patient Instructions (Addendum)
Lips Ketoconazole 2% cream twice daily Mupirocin twice daily for 2 weeks Hydrocortisone  2.5% cream twice daily up to 2 weeks   - Start 5-fluorouracil/calcipotriene cream twice a day for 7 days to affected areas including right forearm, neck, chest. Prescription sent to [facility]. Patient advised they will receive an email to purchase the medication online and have it sent to their home. Patient provided with handout reviewing treatment course and side effects and advised to call or message [place] on MyChart with any concerns.  Instructions for [facility] Medications  One or more of your medications was sent to the [facility] order [facility]. You will receive an email from them and can purchase the medicine through that link. It will then be mailed to your home at the address you confirmed. If for any reason you do not receive an email from them, please check your spam folder. If you still do not find the email, please let us know. [facility] phone number is [phone].    Warts: Use Fluorouracil 5% cream with OTC Salicylic acid to affected areas at bedtime to warts   Recommend daily broad spectrum sunscreen SPF 30+ to sun-exposed areas, reapply every 2 hours as needed. Call for new or changing lesions.  Staying in the shade or wearing long sleeves, sun glasses (UVA+UVB protection) and wide brim hats (4-inch brim around the entire circumference of the hat) are also recommended for sun protection.    Melanoma ABCDEs  Melanoma is the most dangerous type of skin cancer, and is the leading cause of death from skin disease.  You are more likely to develop melanoma if you: Have light-colored skin, light-colored eyes, or red or blond hair Spend a lot of time in the sun Tan regularly, either outdoors or in a tanning bed Have had blistering sunburns, especially during childhood Have a close family member who has had a melanoma Have atypical  moles or large birthmarks  Early detection of melanoma is key since treatment is typically straightforward and cure rates are extremely high if we catch it early.   The first sign of melanoma is often a change in a mole or a new dark spot.  The ABCDE system is a way of remembering the signs of melanoma.  A for asymmetry:  The two halves do not match. B for border:  The edges of the growth are irregular. C for color:  A mixture of colors are present instead of an even brown color. D for diameter:  Melanomas are usually (but not always) greater than 30m - the size of a pencil eraser. E for evolution:  The spot keeps changing in size, shape, and color.  Please check your skin once per month between visits. You can use a small mirror in front and a large mirror behind you to keep an eye on the back side or your body.   If you see any new or changing lesions before your next follow-up, please call to schedule a visit.  Please continue daily skin protection including broad spectrum sunscreen SPF 30+ to sun-exposed areas, reapplying every 2 hours as needed when you're outdoors.   Staying in the shade or wearing long sleeves, sun glasses (UVA+UVB protection) and wide brim hats (4-inch brim around the entire circumference of the hat) are also recommended for sun protection.    Due to recent changes in healthcare laws, you may see results of your pathology and/or laboratory studies on MyChart before the doctors  have had a chance to review them. We understand that in some cases there may be results that are confusing or concerning to you. Please understand that not all results are received at the same time and often the doctors may need to interpret multiple results in order to provide you with the best plan of care or course of treatment. Therefore, we ask that you please give [place] 2 business days to thoroughly review all your results before contacting the office for clarification. Should we see a critical  lab result, you will be contacted sooner.   If You Need Anything After Your Visit  If you have any questions or concerns for your doctor, please call our main line at [phone] and press option 4 to reach your doctor's medical assistant. If no one answers, please leave a voicemail as directed and we will return your call as soon as possible. Messages left after 4 pm will be answered the following business day.   You may also send [place] a message via [place]. We typically respond to MyChart messages within 1-2 business days.  For prescription refills, please ask your pharmacy to contact our office. Our fax number is [phone].  If you have an urgent issue when the clinic is closed that cannot wait until the next business day, you can page your doctor at the number below.    Please note that while we do our best to be available for urgent issues outside of office hours, we are not available 24/7.   If you have an urgent issue and are unable to reach [place], you may choose to seek medical care at your doctor's office, retail clinic, urgent care center, or emergency room.  If you have a medical emergency, please immediately call 911 or go to the emergency department.  Pager Numbers  - Dr. Nehemiah Massed: [phone]  - Dr. Laurence Ferrari: [phone]  - Dr. Nicole Kindred: [phone]  In the event of inclement weather, please call our main line at [phone] for an update on the status of any delays or closures.  Dermatology Medication Tips: Please keep the boxes that topical medications come in in order to help keep track of the instructions about where and how to use these. Pharmacies typically print the medication instructions only on the boxes and not directly on the medication tubes.   If your medication is too expensive, please contact our office at [phone] option 4 or send [place] a message through [place].   We are unable to tell what your co-pay for medications will be in advance as this is  different depending on your insurance coverage. However, we may be able to find a substitute medication at lower cost or fill out paperwork to get insurance to cover a needed medication.   If a prior authorization is required to get your medication covered by your insurance company, please allow [place] 1-2 business days to complete this process.  Drug prices often vary depending on where the prescription is filled and some pharmacies may offer cheaper prices.  The website www.goodrx.com contains coupons for medications through different pharmacies. The prices here do not account for what the cost may be with help from insurance (it may be cheaper with your insurance), but the website can give you the price if you did not use any insurance.  - You can print the associated coupon and take it with your prescription to the pharmacy.  - You may also stop by our office during regular business hours and pick up  a GoodRx coupon card.  - If you need your prescription sent electronically to a different pharmacy, notify our office through [facility] or by phone at [phone] option 4.     Si Usted Necesita Algo Despus de Su Visita  Tambin puede enviarnos un mensaje a travs de Pharmacist, community. Por lo general respondemos a los mensajes de MyChart en el transcurso de 1 a 2 das hbiles.  Para renovar recetas, por favor pida a su farmacia que se ponga en contacto con nuestra oficina. Harland Dingwall de fax es [place] [phone].  Si tiene un asunto urgente cuando la clnica est cerrada y que no puede esperar hasta el siguiente da hbil, puede llamar/localizar a su doctor(a) al nmero que aparece a continuacin.   Por favor, tenga en cuenta que aunque hacemos todo lo posible para estar disponibles para asuntos urgentes fuera del horario de [place], no estamos disponibles las 24 horas del da, los 7 das de la [place].   Si tiene un problema urgente y no puede comunicarse con nosotros, puede optar por buscar  atencin mdica  en el consultorio de su doctor(a), en una clnica privada, en un centro de atencin urgente o en una sala de emergencias.  Si tiene Engineering geologist, por favor llame inmediatamente al 911 o vaya a la sala de emergencias.  Nmeros de bper  - Dr. Nehemiah Massed: [phone]  - Dra. Moye: [phone]  - Dra. Nicole Kindred: [phone]  En caso de inclemencias del [place], por favor llame a Johnsie Kindred principal al [phone] para una actualizacin sobre el [place] de cualquier retraso o cierre.  Consejos para la medicacin en dermatologa: Por favor, guarde las cajas en las que vienen los medicamentos de uso tpico para ayudarle a seguir las instrucciones sobre dnde y cmo usarlos. Las farmacias generalmente imprimen las instrucciones del medicamento slo en las cajas y no directamente en los tubos del [place].   Si su medicamento es muy caro, por favor, pngase en contacto con Zigmund Daniel llamando al [phone] y presione la opcin 4 o envenos un mensaje a travs de Pharmacist, community.   No podemos decirle cul ser su copago por los medicamentos por adelantado ya que esto es diferente dependiendo de la cobertura de su seguro. Sin embargo, es posible que podamos encontrar un medicamento sustituto a Electrical engineer un formulario para que el seguro cubra el medicamento que se considera necesario.   Si se requiere una autorizacin previa para que su compaa de seguros [place] su medicamento, por favor permtanos de 1 a 2 das hbiles para completar este proceso.  Los precios de los medicamentos varan con frecuencia dependiendo del Environmental consultant de dnde se surte la receta y alguna farmacias pueden ofrecer precios ms baratos.  El sitio web www.goodrx.com tiene cupones para medicamentos de Airline pilot. Los precios aqu no tienen en cuenta lo que podra costar con la ayuda del seguro (puede ser ms barato con su seguro), pero el sitio web puede darle el precio si no utiliz  Research scientist (physical sciences).  - Puede imprimir el cupn correspondiente y llevarlo con su receta a la farmacia.  - Tambin puede pasar por nuestra oficina durante el horario de atencin regular y Charity fundraiser una tarjeta de cupones de GoodRx.  - Si necesita que su receta se enve electrnicamente a una farmacia diferente, informe a nuestra oficina a travs de MyChart de [place] o por telfono llamando al [phone] y presione la opcin 4.

## 2022-03-06 ENCOUNTER — Encounter: Payer: Self-pay | Admitting: Dermatology

## 2022-03-25 ENCOUNTER — Ambulatory Visit
Admission: RE | Admit: 2022-03-25 | Discharge: 2022-03-25 | Disposition: A | Discharge status: Home / Self Care | Payer: BC Managed Care – PPO | Source: Ambulatory Visit | Attending: Family Medicine | Admitting: Family Medicine

## 2022-03-25 DIAGNOSIS — Z1231 Encounter for screening mammogram for malignant neoplasm of breast: Secondary | ICD-10-CM | POA: Diagnosis present

## 2022-06-11 ENCOUNTER — Ambulatory Visit: Payer: BC Managed Care – PPO | Admitting: Dermatology

## 2022-06-12 ENCOUNTER — Ambulatory Visit (INDEPENDENT_AMBULATORY_CARE_PROVIDER_SITE_OTHER): Payer: BC Managed Care – PPO | Admitting: Dermatology

## 2022-06-12 ENCOUNTER — Telehealth: Payer: Self-pay

## 2022-06-12 VITALS — BP 157/89 | HR 100

## 2022-06-12 DIAGNOSIS — L570 Actinic keratosis: Secondary | ICD-10-CM

## 2022-06-12 NOTE — Patient Instructions (Addendum)
Codes for chemical peel of precancer spots at chest  Diagnosis code ICD10 Actinic Keratoses L57.0 Procedure code CPT 17004  Due to recent changes in healthcare laws, you may see results of your pathology and/or laboratory studies on MyChart before the doctors have had a chance to review them. We understand that in some cases there may be results that are confusing or concerning to you. Please understand that not all results are received at the same time and often the doctors may need to interpret multiple results in order to provide you with the best plan of care or course of treatment. Therefore, we ask that you please give [place] 2 business days to thoroughly review all your results before contacting the office for clarification. Should we see a critical lab result, you will be contacted sooner.   If You Need Anything After Your Visit  If you have any questions or concerns for your doctor, please call our main line at [phone] and press option 4 to reach your doctor's medical assistant. If no one answers, please leave a voicemail as directed and we will return your call as soon as possible. Messages left after 4 pm will be answered the following business day.   You may also send [place] a message via [place]. We typically respond to MyChart messages within 1-2 business days.  For prescription refills, please ask your pharmacy to contact our office. Our fax number is [phone].  If you have an urgent issue when the clinic is closed that cannot wait until the next business day, you can page your doctor at the number below.    Please note that while we do our best to be available for urgent issues outside of office hours, we are not available 24/7.   If you have an urgent issue and are unable to reach [place], you may choose to seek medical care at your doctor's office, retail clinic, urgent care center, or emergency room.  If you have a medical emergency, please immediately call 911 or go to the  emergency department.  Pager Numbers  - Dr. Nehemiah Massed: [phone]  - Dr. Laurence Ferrari: [phone]  - Dr. Nicole Kindred: [phone]  In the event of inclement weather, please call our main line at [phone] for an update on the status of any delays or closures.  Dermatology Medication Tips: Please keep the boxes that topical medications come in in order to help keep track of the instructions about where and how to use these. Pharmacies typically print the medication instructions only on the boxes and not directly on the medication tubes.   If your medication is too expensive, please contact our office at [phone] option 4 or send [place] a message through [place].   We are unable to tell what your co-pay for medications will be in advance as this is different depending on your insurance coverage. However, we may be able to find a substitute medication at lower cost or fill out paperwork to get insurance to cover a needed medication.   If a prior authorization is required to get your medication covered by your insurance company, please allow [place] 1-2 business days to complete this process.  Drug prices often vary depending on where the prescription is filled and some pharmacies may offer cheaper prices.  The website www.goodrx.com contains coupons for medications through different pharmacies. The prices here do not account for what the cost may be with help from insurance (it may be cheaper with your insurance), but the website can give you  the price if you did not use any insurance.  - You can print the associated coupon and take it with your prescription to the pharmacy.  - You may also stop by our office during regular business hours and pick up a GoodRx coupon card.  - If you need your prescription sent electronically to a different pharmacy, notify our office through [facility] or by phone at [phone] option 4.     Si Usted Necesita Algo Despus de Su Visita  Tambin puede  enviarnos un mensaje a travs de Pharmacist, community. Por lo general respondemos a los mensajes de MyChart en el transcurso de 1 a 2 das hbiles.  Para renovar recetas, por favor pida a su farmacia que se ponga en contacto con nuestra oficina. Harland Dingwall de fax es [place] [phone].  Si tiene un asunto urgente cuando la clnica est cerrada y que no puede esperar hasta el siguiente da hbil, puede llamar/localizar a su doctor(a) al nmero que aparece a continuacin.   Por favor, tenga en cuenta que aunque hacemos todo lo posible para estar disponibles para asuntos urgentes fuera del horario de [place], no estamos disponibles las 24 horas del da, los 7 das de la [place].   Si tiene un problema urgente y no puede comunicarse con nosotros, puede optar por buscar atencin mdica  en el consultorio de su doctor(a), en una clnica privada, en un centro de atencin urgente o en una sala de emergencias.  Si tiene Engineering geologist, por favor llame inmediatamente al 911 o vaya a la sala de emergencias.  Nmeros de bper  - Dr. Nehemiah Massed: [phone]  - Dra. Moye: [phone]  - Dra. Nicole Kindred: [phone]  En caso de inclemencias del [place], por favor llame a Johnsie Kindred principal al [phone] para una actualizacin sobre el [place] de cualquier retraso o cierre.  Consejos para la medicacin en dermatologa: Por favor, guarde las cajas en las que vienen los medicamentos de uso tpico para ayudarle a seguir las instrucciones sobre dnde y cmo usarlos. Las farmacias generalmente imprimen las instrucciones del medicamento slo en las cajas y no directamente en los tubos del [place].   Si su medicamento es muy caro, por favor, pngase en contacto con Zigmund Daniel llamando al [phone] y presione la opcin 4 o envenos un mensaje a travs de Pharmacist, community.   No podemos decirle cul ser su copago por los medicamentos por adelantado ya que esto es diferente dependiendo de la cobertura de su seguro.  Sin embargo, es posible que podamos encontrar un medicamento sustituto a Electrical engineer un formulario para que el seguro cubra el medicamento que se considera necesario.   Si se requiere una autorizacin previa para que su compaa de seguros [place] su medicamento, por favor permtanos de 1 a 2 das hbiles para completar este proceso.  Los precios de los medicamentos varan con frecuencia dependiendo del Environmental consultant de dnde se surte la receta y alguna farmacias pueden ofrecer precios ms baratos.  El sitio web www.goodrx.com tiene cupones para medicamentos de Airline pilot. Los precios aqu no tienen en cuenta lo que podra costar con la ayuda del seguro (puede ser ms barato con su seguro), pero el sitio web puede darle el precio si no utiliz Research scientist (physical sciences).  - Puede imprimir el cupn correspondiente y llevarlo con su receta a la farmacia.  - Tambin puede pasar por nuestra oficina durante el horario de atencin regular y Charity fundraiser una tarjeta de cupones de GoodRx.  - Si necesita  que su receta se enve electrnicamente a Chiropodist, informe a nuestra oficina a travs de MyChart de [place] o por telfono llamando al [phone] y presione la opcin 4.

## 2022-06-12 NOTE — Progress Notes (Signed)
   Follow-Up Visit   Subjective  Nicole Chavez is a 57 y.o. female who presents for the following: Hypertrophic AK (Of the R upper arm - previous visit states treated with LN2, but patient doesn't recall area being treated at all).  The following portions of the chart were reviewed this encounter and updated as appropriate:   Tobacco  Allergies  Meds  Problems  Med Hx  Surg Hx  Fam Hx      Review of Systems:  No other skin or systemic complaints except as noted in HPI or Assessment and Plan.  Objective  Well appearing patient in no apparent distress; mood and affect are within normal limits.  A focused examination was performed including the right upper arm. Relevant physical exam findings are noted in the Assessment and Plan.  R upper arm Erythematous thin papules/macules with thicker gritty scale.     Assessment & Plan  AK (actinic keratosis) R upper arm  Hypertrophic - patient does not recall lesion being treated with LN2, but she was charged for LN2 at her previous visit so I will treat today at no charge  Call if site does not clear  Destruction of lesion - R upper arm Complexity: simple   Destruction method: cryotherapy   Informed consent: discussed and consent obtained   Timeout:  patient name, date of birth, surgical site, and procedure verified Lesion destroyed using liquid nitrogen: Yes   Region frozen until ice ball extended beyond lesion: Yes   Outcome: patient tolerated procedure well with no complications   Post-procedure details: wound care instructions given   Additional details:  Prior to procedure, discussed risks of blister formation, small wound, skin dyspigmentation, or rare scar following cryotherapy. Recommend Vaseline ointment to treated areas while healing.    Return for appointment as scheduled.  Luther Redo, CMA, am acting as scribe for Forest Gleason, MD .  Documentation: I have reviewed the above documentation for accuracy and  completeness, and I agree with the above.  Forest Gleason, MD

## 2022-06-12 NOTE — Progress Notes (Deleted)
   Follow-Up Visit   Subjective  Nicole Chavez is a 57 y.o. female who presents for the following: No chief complaint on file..   The following portions of the chart were reviewed this encounter and updated as appropriate:       Review of Systems:  No other skin or systemic complaints except as noted in HPI or Assessment and Plan.  Objective  Well appearing patient in no apparent distress; mood and affect are within normal limits.  A focused examination was performed including the face and arms. Relevant physical exam findings are noted in the Assessment and Plan.    Assessment & Plan   No follow-ups on file.

## 2022-06-16 NOTE — Telephone Encounter (Signed)
Opened in error

## 2022-06-24 ENCOUNTER — Encounter: Payer: Self-pay | Admitting: Dermatology

## 2022-07-01 ENCOUNTER — Telehealth: Payer: Self-pay

## 2022-07-01 NOTE — Telephone Encounter (Signed)
Patient has questions regarding a peel for precancerous lesions. Was told you have to order the medication? She wants to know does she have to pay for this medication separate? Or is it included in the CPT code for the peel? AND she wants to know if this CPT code [id number], that you gave her, also cover the follow up? Or is there another office visit fee for follow up?Nicole Chavez She would like for you to call her regarding these questions.  Thank you!

## 2022-07-01 NOTE — Telephone Encounter (Signed)
Error

## 2022-07-02 NOTE — Telephone Encounter (Signed)
No charge for medicine, just the CPT 17004 code. No office visit charge that day if we are just doing the peel. Any visits in the 10 days after the peel are also included in that CPT code. If she needs to speak with me specifically, let me know and I can call tomorrow. Thanks!

## 2022-07-03 ENCOUNTER — Other Ambulatory Visit: Payer: Self-pay | Admitting: Dermatology

## 2022-07-03 MED ORDER — VALACYCLOVIR HCL 500 MG PO TABS: 500.0000 mg | ORAL_TABLET | Freq: Two times a day (BID) | ORAL | 0 refills | Status: DC

## 2022-07-03 NOTE — Progress Notes (Signed)
Sent in valacyclovir for prophylaxis before peel.

## 2022-07-03 NOTE — Telephone Encounter (Signed)
Discussed option of cosmetic TCA peel with patient for $280. Will plan for that.

## 2022-07-03 NOTE — Telephone Encounter (Signed)
Please call her. She would like to speak with you.

## 2022-07-07 ENCOUNTER — Other Ambulatory Visit: Payer: Self-pay

## 2022-07-07 MED ORDER — VALACYCLOVIR HCL 500 MG PO TABS: 500.0000 mg | ORAL_TABLET | Freq: Two times a day (BID) | ORAL | 0 refills | Status: AC

## 2022-07-07 NOTE — Progress Notes (Signed)
RX re sent due to E Scribing error

## 2022-08-05 ENCOUNTER — Encounter: Payer: Self-pay | Admitting: Dermatology

## 2022-08-05 ENCOUNTER — Ambulatory Visit (INDEPENDENT_AMBULATORY_CARE_PROVIDER_SITE_OTHER): Payer: BC Managed Care – PPO | Admitting: Dermatology

## 2022-08-05 VITALS — BP 162/81 | HR 75

## 2022-08-05 DIAGNOSIS — B079 Viral wart, unspecified: Secondary | ICD-10-CM | POA: Diagnosis not present

## 2022-08-05 DIAGNOSIS — D492 Neoplasm of unspecified behavior of bone, soft tissue, and skin: Secondary | ICD-10-CM

## 2022-08-05 NOTE — Patient Instructions (Signed)
Wound Care Instructions  Cleanse wound gently with soap and water once a day then pat dry with clean gauze. Apply a thin coat of Petrolatum (petroleum jelly, "Vaseline") over the wound (unless you have an allergy to this). We recommend that you use a new, sterile tube of Vaseline. Do not pick or remove scabs. Do not remove the yellow or white "healing tissue" from the base of the wound.  Cover the wound with fresh, clean, nonstick gauze and secure with paper tape. You may use Band-Aids in place of gauze and tape if the wound is small enough, but would recommend trimming much of the tape off as there is often too much. Sometimes Band-Aids can irritate the skin.  You should call the office for your biopsy report after 1 week if you have not already been contacted.  If you experience any problems, such as abnormal amounts of bleeding, swelling, significant bruising, significant pain, or evidence of infection, please call the office immediately.  FOR ADULT SURGERY PATIENTS: If you need something for pain relief you may take 1 extra strength Tylenol (acetaminophen) AND 2 Ibuprofen (200mg each) together every 4 hours as needed for pain. (do not take these if you are allergic to them or if you have a reason you should not take them.) Typically, you may only need pain medication for 1 to 3 days.     Due to recent changes in healthcare laws, you may see results of your pathology and/or laboratory studies on MyChart before the doctors have had a chance to review them. We understand that in some cases there may be results that are confusing or concerning to you. Please understand that not all results are received at the same time and often the doctors may need to interpret multiple results in order to provide you with the best plan of care or course of treatment. Therefore, we ask that you please give us 2 business days to thoroughly review all your results before contacting the office for clarification. Should  we see a critical lab result, you will be contacted sooner.   If You Need Anything After Your Visit  If you have any questions or concerns for your doctor, please call our main line at [phone] and press option 4 to reach your doctor's medical assistant. If no one answers, please leave a voicemail as directed and we will return your call as soon as possible. Messages left after 4 pm will be answered the following business day.   You may also send us a message via MyChart. We typically respond to MyChart messages within 1-2 business days.  For prescription refills, please ask your pharmacy to contact our office. Our fax number is [phone].  If you have an urgent issue when the clinic is closed that cannot wait until the next business day, you can page your doctor at the number below.    Please note that while we do our best to be available for urgent issues outside of office hours, we are not available 24/7.   If you have an urgent issue and are unable to reach us, you may choose to seek medical care at your doctor's office, retail clinic, urgent care center, or emergency room.  If you have a medical emergency, please immediately call 911 or go to the emergency department.  Pager Numbers  - Dr. Kowalski: [phone]  - Dr. Moye: [phone]  - Dr. Stewart: [phone]  In the event of inclement weather, please call our main line at   [phone] for an update on the status of any delays or closures.  Dermatology Medication Tips: Please keep the boxes that topical medications come in in order to help keep track of the instructions about where and how to use these. Pharmacies typically print the medication instructions only on the boxes and not directly on the medication tubes.   If your medication is too expensive, please contact our office at [phone] option 4 or send us a message through MyChart.   We are unable to tell what your co-pay for medications will be in  advance as this is different depending on your insurance coverage. However, we may be able to find a substitute medication at lower cost or fill out paperwork to get insurance to cover a needed medication.   If a prior authorization is required to get your medication covered by your insurance company, please allow us 1-2 business days to complete this process.  Drug prices often vary depending on where the prescription is filled and some pharmacies may offer cheaper prices.  The website www.goodrx.com contains coupons for medications through different pharmacies. The prices here do not account for what the cost may be with help from insurance (it may be cheaper with your insurance), but the website can give you the price if you did not use any insurance.  - You can print the associated coupon and take it with your prescription to the pharmacy.  - You may also stop by our office during regular business hours and pick up a GoodRx coupon card.  - If you need your prescription sent electronically to a different pharmacy, notify our office through [facility] MyChart or by phone at [phone] option 4.     Si Usted Necesita Algo Despus de Su Visita  Tambin puede enviarnos un mensaje a travs de MyChart. Por lo general respondemos a los mensajes de MyChart en el transcurso de 1 a 2 das hbiles.  Para renovar recetas, por favor pida a su farmacia que se ponga en contacto con nuestra oficina. Nuestro nmero de fax es el [phone].  Si tiene un asunto urgente cuando la clnica est cerrada y que no puede esperar hasta el siguiente da hbil, puede llamar/localizar a su doctor(a) al nmero que aparece a continuacin.   Por favor, tenga en cuenta que aunque hacemos todo lo posible para estar disponibles para asuntos urgentes fuera del horario de oficina, no estamos disponibles las 24 horas del da, los 7 das de la semana.   Si tiene un problema urgente y no puede comunicarse con nosotros, puede  optar por buscar atencin mdica  en el consultorio de su doctor(a), en una clnica privada, en un centro de atencin urgente o en una sala de emergencias.  Si tiene una emergencia mdica, por favor llame inmediatamente al 911 o vaya a la sala de emergencias.  Nmeros de bper  - Dr. Kowalski: [phone]  - Dra. Moye: [phone]  - Dra. Stewart: [phone]  En caso de inclemencias del tiempo, por favor llame a nuestra lnea principal al [phone] para una actualizacin sobre el estado de cualquier retraso o cierre.  Consejos para la medicacin en dermatologa: Por favor, guarde las cajas en las que vienen los medicamentos de uso tpico para ayudarle a seguir las instrucciones sobre dnde y cmo usarlos. Las farmacias generalmente imprimen las instrucciones del medicamento slo en las cajas y no directamente en los tubos del medicamento.   Si su medicamento es muy caro, por favor, pngase en contacto con   nuestra oficina llamando al [phone] y presione la opcin 4 o envenos un mensaje a travs de MyChart.   No podemos decirle cul ser su copago por los medicamentos por adelantado ya que esto es diferente dependiendo de la cobertura de su seguro. Sin embargo, es posible que podamos encontrar un medicamento sustituto a menor costo o llenar un formulario para que el seguro cubra el medicamento que se considera necesario.   Si se requiere una autorizacin previa para que su compaa de seguros cubra su medicamento, por favor permtanos de 1 a 2 das hbiles para completar este proceso.  Los precios de los medicamentos varan con frecuencia dependiendo del lugar de dnde se surte la receta y alguna farmacias pueden ofrecer precios ms baratos.  El sitio web www.goodrx.com tiene cupones para medicamentos de diferentes farmacias. Los precios aqu no tienen en cuenta lo que podra costar con la ayuda del seguro (puede ser ms barato con su seguro), pero el sitio web puede darle el  precio si no utiliz ningn seguro.  - Puede imprimir el cupn correspondiente y llevarlo con su receta a la farmacia.  - Tambin puede pasar por nuestra oficina durante el horario de atencin regular y recoger una tarjeta de cupones de GoodRx.  - Si necesita que su receta se enve electrnicamente a una farmacia diferente, informe a nuestra oficina a travs de MyChart de [facility] o por telfono llamando al [phone] y presione la opcin 4.  

## 2022-08-05 NOTE — Progress Notes (Signed)
   Follow-Up Visit   Subjective  Nicole Chavez is a 57 y.o. female who presents for the following: Actinic keratosis. Right upper arm. Tx with LN2 06/12/2022  The patient has spots, moles and lesions to be evaluated, some may be new or changing and the patient has concerns that these could be cancer.   The following portions of the chart were reviewed this encounter and updated as appropriate: medications, allergies, medical history  Review of Systems:  No other skin or systemic complaints except as noted in HPI or Assessment and Plan.  Objective  Well appearing patient in no apparent distress; mood and affect are within normal limits.  A focused examination was performed of the following areas: Right upper arm  Relevant exam findings are noted in the Assessment and Plan.  Right Upper Arm 0.6 cm firm scaly pink papule         Assessment & Plan   Neoplasm of skin Right Upper Arm  Skin / nail biopsy Type of biopsy: tangential   Informed consent: discussed and consent obtained   Anesthesia: the lesion was anesthetized in a standard fashion   Anesthesia comment:  Area prepped with alcohol Anesthetic:  1% lidocaine w/ epinephrine 1-100,000 buffered w/ 8.4% NaHCO3 Instrument used: flexible razor blade   Hemostasis achieved with: pressure, aluminum chloride and electrodesiccation   Outcome: patient tolerated procedure well   Post-procedure details: wound care instructions given   Post-procedure details comment:  Ointment and small bandage applied  Specimen 1 - Surgical pathology Differential Diagnosis: R/O SCC vs verruca  Check Margins: No      Return for TBSE As Scheduled.  I, Emelia Salisbury, CMA, am acting as scribe for Forest Gleason, MD.   Documentation: I have reviewed the above documentation for accuracy and completeness, and I agree with the above.  Forest Gleason, MD

## 2022-08-07 ENCOUNTER — Telehealth: Payer: Self-pay

## 2022-08-07 NOTE — Telephone Encounter (Signed)
-----   Message from [place], MD sent at 08/07/2022  4:54 PM EDT ----- Skin , right upper arm VERRUCA VULGARIS, IRRITATED --> This is a WART caused by the human papilloma virus. It is not dangerous but is contagious and can spread to other areas of skin or other people if it is not completely gone. No additional treatment is needed. However, if it comes back, we can freeze it in clinic with liquid nitrogen (a quick in office procedure) or you can also treat it at home with an over the counter salicylic wart treatment (slower).  Please call the office at [phone] or message [place] if you have have any questions.  MAs please call. Thank you!

## 2022-08-07 NOTE — Telephone Encounter (Signed)
Discussed pathology results. Patient voiced understanding.  

## 2022-08-27 ENCOUNTER — Encounter: Payer: BC Managed Care – PPO | Admitting: Dermatology

## 2022-09-02 ENCOUNTER — Ambulatory Visit (INDEPENDENT_AMBULATORY_CARE_PROVIDER_SITE_OTHER): Payer: BC Managed Care – PPO | Admitting: Dermatology

## 2022-09-02 ENCOUNTER — Encounter: Payer: Self-pay | Admitting: Dermatology

## 2022-09-02 VITALS — BP 142/81 | HR 100

## 2022-09-02 DIAGNOSIS — L570 Actinic keratosis: Secondary | ICD-10-CM | POA: Diagnosis not present

## 2022-09-02 DIAGNOSIS — L578 Other skin changes due to chronic exposure to nonionizing radiation: Secondary | ICD-10-CM | POA: Diagnosis not present

## 2022-09-02 DIAGNOSIS — Z85828 Personal history of other malignant neoplasm of skin: Secondary | ICD-10-CM

## 2022-09-02 DIAGNOSIS — L814 Other melanin hyperpigmentation: Secondary | ICD-10-CM

## 2022-09-02 DIAGNOSIS — D229 Melanocytic nevi, unspecified: Secondary | ICD-10-CM

## 2022-09-02 DIAGNOSIS — L821 Other seborrheic keratosis: Secondary | ICD-10-CM

## 2022-09-02 DIAGNOSIS — Z1283 Encounter for screening for malignant neoplasm of skin: Secondary | ICD-10-CM

## 2022-09-02 NOTE — Progress Notes (Signed)
Follow-Up Visit   Subjective  Nicole Chavez is a 57 y.o. female who presents for the following: Skin Cancer Screening and Full Body Skin Exam. HxBCC. HxAKs  The patient presents for Total-Body Skin Exam (TBSE) for skin cancer screening and mole check. The patient has spots, moles and lesions to be evaluated, some may be new or changing and the patient has concerns that these could be cancer.    The following portions of the chart were reviewed this encounter and updated as appropriate: medications, allergies, medical history  Review of Systems:  No other skin or systemic complaints except as noted in HPI or Assessment and Plan.  Objective  Well appearing patient in no apparent distress; mood and affect are within normal limits.  A full examination was performed including scalp, head, eyes, ears, nose, lips, neck, chest, axillae, abdomen, back, buttocks, bilateral upper extremities, bilateral lower extremities, hands, feet, fingers, toes, fingernails, and toenails. All findings within normal limits unless otherwise noted below.   Relevant physical exam findings are noted in the Assessment and Plan.    Assessment & Plan   HISTORY OF BASAL CELL CARCINOMA OF THE SKIN. Right superior shoulder. EDC 09/11/2020. - No evidence of recurrence today - Recommend regular full body skin exams - Recommend daily broad spectrum sunscreen SPF 30+ to sun-exposed areas, reapply every 2 hours as needed.  - Call if any new or changing lesions are noted between office visits   LENTIGINES, SEBORRHEIC KERATOSES, HEMANGIOMAS - Benign normal skin lesions - Benign-appearing - Call for any changes  MELANOCYTIC NEVI - Tan-brown and/or pink-flesh-colored symmetric macules and papules - Benign appearing on exam today - Observation - Call clinic for new or changing moles - Recommend daily use of broad spectrum spf 30+ sunscreen to sun-exposed areas.   ACTINIC DAMAGE - Chronic condition, secondary to  cumulative UV/sun exposure - diffuse scaly erythematous macules with underlying dyspigmentation - Recommend daily broad spectrum sunscreen SPF 30+ to sun-exposed areas, reapply every 2 hours as needed.  - Staying in the shade or wearing long sleeves, sun glasses (UVA+UVB protection) and wide brim hats (4-inch brim around the entire circumference of the hat) are also recommended for sun protection.  - Call for new or changing lesions.  MELANOCYTIC NEVUS  right labia majora 0.4 cm medium-dark brown macule without features suspicious for malignancy on dermoscopy   Treatment Plan: Benign appearing on exam today. Recommend observation. Call clinic for new or changing moles. Recommend daily use of broad spectrum spf 30+ sunscreen to sun-exposed areas.    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 - Continue to monitor.  - Recommend regular full body skin exams - Recommend daily broad spectrum sunscreen SPF 30+ to sun-exposed areas, reapply every 2 hours as needed.  - Call if any new or changing lesions are noted between office visits   SKIN CANCER SCREENING PERFORMED TODAY.   ACTINIC KERATOSIS Exam: Erythematous thin papules/macules with gritty scale at the calf  Actinic keratoses are precancerous spots that appear secondary to cumulative UV radiation exposure/sun exposure over time. They are chronic with expected duration over 1 year. A portion of actinic keratoses will progress to squamous cell carcinoma of the skin. It is not possible to reliably predict which spots will progress to skin cancer and so treatment is recommended to prevent development of skin cancer.  Recommend daily broad spectrum sunscreen SPF 30+ to sun-exposed areas, reapply every 2 hours  as needed.  Recommend staying in the shade or wearing long sleeves, sun glasses (UVA+UVB protection) and wide brim hats (4-inch brim around the entire circumference of the  hat). Call for new or changing lesions.  Treatment Plan: Apply 5-fluorouracil cream followed by calcipotriene cream twice a day for 7 days to affected areas at the body.  Reviewed course of treatment and expected reaction.  Patient advised to expect inflammation and crusting and advised that erosions are possible.  Patient advised to be diligent with sun protection during and after treatment. Handout with details of how to apply medication and what to expect provided. Counseled to keep medication out of reach of children and pets.  Return in about 9 months (around 06/04/2023) for TBSE.  I, Lawson Radar, CMA, am acting as scribe for Darden Dates, MD.   Documentation: I have reviewed the above documentation for accuracy and completeness, and I agree with the above.  Darden Dates, MD

## 2022-09-02 NOTE — Patient Instructions (Addendum)
Recommend daily broad spectrum sunscreen SPF 30+ to sun-exposed areas, reapply every 2 hours as needed. Call for new or changing lesions.  Staying in the shade or wearing long sleeves, sun glasses (UVA+UVB protection) and wide brim hats (4-inch brim around the entire circumference of the hat) are also recommended for sun protection.    Recommend taking Heliocare sun protection supplement daily in sunny weather for additional sun protection. For maximum protection on the sunniest days, you can take up to 2 capsules of regular Heliocare OR take 1 capsule of Heliocare Ultra. For prolonged exposure (such as a full day in the sun), you can repeat your dose of the supplement 4 hours after your first dose. Heliocare can be purchased at [employer], at some [facility] or at [email or web address].    Melanoma ABCDEs  Melanoma is the most dangerous type of skin cancer, and is the leading cause of death from skin disease.  You are more likely to develop melanoma if you: Have light-colored skin, light-colored eyes, or red or blond hair Spend a lot of time in the sun Tan regularly, either outdoors or in a tanning bed Have had blistering sunburns, especially during childhood Have a close family member who has had a melanoma Have atypical moles or large birthmarks  Early detection of melanoma is key since treatment is typically straightforward and cure rates are extremely high if we catch it early.   The first sign of melanoma is often a change in a mole or a new dark spot.  The ABCDE system is a way of remembering the signs of melanoma.  A for asymmetry:  The two halves do not match. B for border:  The edges of the growth are irregular. C for color:  A mixture of colors are present instead of an even brown color. D for diameter:  Melanomas are usually (but not always) greater than 6mm - the size of a pencil eraser. E for evolution:  The spot keeps changing in size, shape, and color.  Please check  your skin once per month between visits. You can use a small mirror in front and a large mirror behind you to keep an eye on the back side or your body.   If you see any new or changing lesions before your next follow-up, please call to schedule a visit.  Please continue daily skin protection including broad spectrum sunscreen SPF 30+ to sun-exposed areas, reapplying every 2 hours as needed when you're outdoors.   Staying in the shade or wearing long sleeves, sun glasses (UVA+UVB protection) and wide brim hats (4-inch brim around the entire circumference of the hat) are also recommended for sun protection.     Counseling for BBL / IPL / Laser and Coordination of Care Discussed the treatment option of SCITON Broad Band Light (BBL) /Intense Pulsed Light (IPL)/ Laser for skin discoloration, including brown spots and redness.  Typically we recommend at least 1-3 treatment sessions about 5-8 weeks apart for best results.  Cannot have tanned skin when BBL performed, and regular use of sunscreen is advised after the procedure to help maintain results. The patient's condition may also require "maintenance treatments" in the future.  The fee for BBL / laser treatments is $350 per treatment session for the whole face.  A fee can be quoted for other parts of the body.  Insurance typically does not pay for BBL/laser treatments and therefore the fee is an out-of-pocket cost.    Due to recent changes in healthcare  laws, you may see results of your pathology and/or laboratory studies on MyChart before the doctors have had a chance to review them. We understand that in some cases there may be results that are confusing or concerning to you. Please understand that not all results are received at the same time and often the doctors may need to interpret multiple results in order to provide you with the best plan of care or course of treatment. Therefore, we ask that you please give [place] 2 business days to thoroughly  review all your results before contacting the office for clarification. Should we see a critical lab result, you will be contacted sooner.   If You Need Anything After Your Visit  If you have any questions or concerns for your doctor, please call our main line at [phone] and press option 4 to reach your doctor's medical assistant. If no one answers, please leave a voicemail as directed and we will return your call as soon as possible. Messages left after 4 pm will be answered the following business day.   You may also send [place] a message via MyChart. We typically respond to MyChart messages within 1-2 business days.  For prescription refills, please ask your pharmacy to contact our office. Our fax number is [phone].  If you have an urgent issue when the clinic is closed that cannot wait until the next business day, you can page your doctor at the number below.    Please note that while we do our best to be available for urgent issues outside of office hours, we are not available 24/7.   If you have an urgent issue and are unable to reach [place], you may choose to seek medical care at your doctor's office, retail clinic, urgent care center, or emergency room.  If you have a medical emergency, please immediately call 911 or go to the emergency department.  Pager Numbers  - Dr. Gwen Pounds: [phone]  - Dr. Neale Burly: [phone]  - Dr. Roseanne Reno: [phone]  In the event of inclement weather, please call our main line at [phone] for an update on the status of any delays or closures.  Dermatology Medication Tips: Please keep the boxes that topical medications come in in order to help keep track of the instructions about where and how to use these. Pharmacies typically print the medication instructions only on the boxes and not directly on the medication tubes.   If your medication is too expensive, please contact our office at [phone] option 4 or send [place] a message through  MyChart.   We are unable to tell what your co-pay for medications will be in advance as this is different depending on your insurance coverage. However, we may be able to find a substitute medication at lower cost or fill out paperwork to get insurance to cover a needed medication.   If a prior authorization is required to get your medication covered by your insurance company, please allow [place] 1-2 business days to complete this process.  Drug prices often vary depending on where the prescription is filled and some pharmacies may offer cheaper prices.  The website www.goodrx.com contains coupons for medications through different pharmacies. The prices here do not account for what the cost may be with help from insurance (it may be cheaper with your insurance), but the website can give you the price if you did not use any insurance.  - You can print the associated coupon and take it with your prescription to the pharmacy.  -  You may also stop by our office during regular business hours and pick up a GoodRx coupon card.  - If you need your prescription sent electronically to a different pharmacy, notify our office through [facility] or by phone at [phone] option 4.     Si Usted Necesita Algo Despus de Su Visita  Tambin puede enviarnos un mensaje a travs de Clinical cytogeneticist. Por lo general respondemos a los mensajes de MyChart en el transcurso de 1 a 2 das hbiles.  Para renovar recetas, por favor pida a su farmacia que se ponga en contacto con nuestra oficina. Annie Sable de fax es [place] [phone].  Si tiene un asunto urgente cuando la clnica est cerrada y que no puede esperar hasta el siguiente da hbil, puede llamar/localizar a su doctor(a) al nmero que aparece a continuacin.   Por favor, tenga en cuenta que aunque hacemos todo lo posible para estar disponibles para asuntos urgentes fuera del horario de [place], no estamos disponibles las 24 horas del da, los 7 [address]  [address] de la  [place].   Si tiene un problema urgente y no puede comunicarse con nosotros, puede optar por buscar atencin mdica  en el consultorio de su doctor(a), en una clnica privada, en un centro de atencin urgente o en una sala de emergencias.  Si tiene Engineer, drilling, por favor llame inmediatamente al 911 o vaya a la sala de emergencias.  Nmeros de bper  - Dr. Gwen Pounds: [phone]  - Dra. Moye: [phone]  - Dra. Roseanne Reno: [phone]  En caso de inclemencias del [place], por favor llame a Lacy Duverney principal al [phone] para una actualizacin sobre el [place] de cualquier retraso o cierre.  Consejos para la medicacin en dermatologa: Por favor, guarde las cajas en las que vienen los medicamentos de uso tpico para ayudarle a seguir las instrucciones sobre dnde y cmo usarlos. Las farmacias generalmente imprimen las instrucciones del medicamento slo en las cajas y no directamente en los tubos del [place].   Si su medicamento es muy caro, por favor, pngase en contacto con Rolm Gala llamando al [phone] y presione la opcin 4 o envenos un mensaje a travs de Clinical cytogeneticist.   No podemos decirle cul ser su copago por los medicamentos por adelantado ya que esto es diferente dependiendo de la cobertura de su seguro. Sin embargo, es posible que podamos encontrar un medicamento sustituto a Audiological scientist un formulario para que el seguro cubra el medicamento que se considera necesario.   Si se requiere una autorizacin previa para que su compaa de seguros [place] su medicamento, por favor permtanos de 1 a 2 das hbiles para completar [address]  Los precios de los medicamentos varan con frecuencia dependiendo del Environmental consultant de dnde se surte la receta y alguna farmacias pueden ofrecer precios ms baratos.  El sitio web www.goodrx.com tiene cupones para medicamentos de Health and safety inspector. Los precios aqu no tienen en cuenta lo que podra costar con la ayuda del  seguro (puede ser ms barato con su seguro), pero el sitio web puede darle el precio si no utiliz Tourist information centre manager.  - Puede imprimir el cupn correspondiente y llevarlo con su receta a la farmacia.  - Tambin puede pasar por nuestra oficina durante el horario de atencin regular y Education officer, museum una tarjeta de cupones de GoodRx.  - Si necesita que su receta se enve electrnicamente a Psychiatrist, informe a nuestra oficina a travs de MyChart de [place] o por telfono llamando al [phone] y  presione la opcin 4.  

## 2022-11-21 ENCOUNTER — Other Ambulatory Visit: Payer: Self-pay | Admitting: Dermatology

## 2022-11-21 DIAGNOSIS — K130 Diseases of lips: Secondary | ICD-10-CM

## 2022-11-24 ENCOUNTER — Other Ambulatory Visit: Payer: Self-pay

## 2023-02-26 ENCOUNTER — Other Ambulatory Visit: Payer: Self-pay | Admitting: Family Medicine

## 2023-02-26 DIAGNOSIS — Z1231 Encounter for screening mammogram for malignant neoplasm of breast: Secondary | ICD-10-CM

## 2023-03-27 ENCOUNTER — Ambulatory Visit
Admission: RE | Admit: 2023-03-27 | Discharge: 2023-03-27 | Disposition: A | Discharge status: Home / Self Care | Payer: BC Managed Care – PPO | Source: Ambulatory Visit | Attending: Family Medicine | Admitting: Family Medicine

## 2023-03-27 DIAGNOSIS — Z1231 Encounter for screening mammogram for malignant neoplasm of breast: Secondary | ICD-10-CM | POA: Diagnosis present

## 2023-05-26 ENCOUNTER — Encounter: Payer: Self-pay | Admitting: Dermatology

## 2023-05-26 ENCOUNTER — Ambulatory Visit (INDEPENDENT_AMBULATORY_CARE_PROVIDER_SITE_OTHER): Payer: BC Managed Care – PPO | Admitting: Dermatology

## 2023-05-26 DIAGNOSIS — L821 Other seborrheic keratosis: Secondary | ICD-10-CM

## 2023-05-26 DIAGNOSIS — D1801 Hemangioma of skin and subcutaneous tissue: Secondary | ICD-10-CM

## 2023-05-26 DIAGNOSIS — L814 Other melanin hyperpigmentation: Secondary | ICD-10-CM | POA: Diagnosis not present

## 2023-05-26 DIAGNOSIS — L570 Actinic keratosis: Secondary | ICD-10-CM

## 2023-05-26 DIAGNOSIS — L988 Other specified disorders of the skin and subcutaneous tissue: Secondary | ICD-10-CM

## 2023-05-26 DIAGNOSIS — W908XXA Exposure to other nonionizing radiation, initial encounter: Secondary | ICD-10-CM | POA: Diagnosis not present

## 2023-05-26 DIAGNOSIS — D2339 Other benign neoplasm of skin of other parts of face: Secondary | ICD-10-CM

## 2023-05-26 DIAGNOSIS — Z1283 Encounter for screening for malignant neoplasm of skin: Secondary | ICD-10-CM | POA: Diagnosis not present

## 2023-05-26 DIAGNOSIS — L578 Other skin changes due to chronic exposure to nonionizing radiation: Secondary | ICD-10-CM

## 2023-05-26 DIAGNOSIS — D492 Neoplasm of unspecified behavior of bone, soft tissue, and skin: Secondary | ICD-10-CM

## 2023-05-26 DIAGNOSIS — Z85828 Personal history of other malignant neoplasm of skin: Secondary | ICD-10-CM

## 2023-05-26 DIAGNOSIS — Z872 Personal history of diseases of the skin and subcutaneous tissue: Secondary | ICD-10-CM

## 2023-05-26 DIAGNOSIS — Z7189 Other specified counseling: Secondary | ICD-10-CM

## 2023-05-26 DIAGNOSIS — D229 Melanocytic nevi, unspecified: Secondary | ICD-10-CM

## 2023-05-26 DIAGNOSIS — D239 Other benign neoplasm of skin, unspecified: Secondary | ICD-10-CM

## 2023-05-26 DIAGNOSIS — D225 Melanocytic nevi of trunk: Secondary | ICD-10-CM

## 2023-05-26 NOTE — Patient Instructions (Addendum)
 Apply 5-fluorouracil cream followed by calcipotriene cream twice a day for 7 days to affected areas at the body. Instructions for [facility] Medications  One or more of your medications was sent to the [facility] mail order compounding pharmacy. You will receive an email from them and can purchase the medicine through that link. It will then be mailed to your home at the address you confirmed. If for any reason you do not receive an email from them, please check your spam folder. If you still do not find the email, please let us  know. [facility] phone number is [phone].    Recommend daily broad spectrum sunscreen SPF 30+ to sun-exposed areas, reapply every 2 hours as needed. Call for new or changing lesions.  Staying in the shade or wearing long sleeves, sun glasses (UVA+UVB protection) and wide brim hats (4-inch brim around the entire circumference of the hat) are also recommended for sun protection.      Melanoma ABCDEs  Melanoma is the most dangerous type of skin cancer, and is the leading cause of death from skin disease.  You are more likely to develop melanoma if you: Have light-colored skin, light-colored eyes, or red or blond hair Spend a lot of time in the sun Tan regularly, either outdoors or in a tanning bed Have had blistering sunburns, especially during childhood Have a close family member who has had a melanoma Have atypical moles or large birthmarks  Early detection of melanoma is key since treatment is typically straightforward and cure rates are extremely high if we catch it early.   The first sign of melanoma is often a change in a mole or a new dark spot.  The ABCDE system is a way of remembering the signs of melanoma.  A for asymmetry:  The two halves do not match. B for border:  The edges of the growth are irregular. C for color:  A mixture of colors are present instead of an even brown color. D for diameter:  Melanomas are usually (but not  always) greater than 6mm - the size of a pencil eraser. E for evolution:  The spot keeps changing in size, shape, and color.  Please check your skin once per month between visits. You can use a small mirror in front and a large mirror behind you to keep an eye on the back side or your body.   If you see any new or changing lesions before your next follow-up, please call to schedule a visit.  Please continue daily skin protection including broad spectrum sunscreen SPF 30+ to sun-exposed areas, reapplying every 2 hours as needed when you're outdoors.   Staying in the shade or wearing long sleeves, sun glasses (UVA+UVB protection) and wide brim hats (4-inch brim around the entire circumference of the hat) are also recommended for sun protection.     Due to recent changes in healthcare laws, you may see results of your pathology and/or laboratory studies on MyChart before the doctors have had a chance to review them. We understand that in some cases there may be results that are confusing or concerning to you. Please understand that not all results are received at the same time and often the doctors may need to interpret multiple results in order to provide you with the best plan of care or course of treatment. Therefore, we ask that you please give us  2 business days to thoroughly review all your results before contacting the office for clarification. Should we see  a critical lab result, you will be contacted sooner.   If You Need Anything After Your Visit  If you have any questions or concerns for your doctor, please call our main line at [phone] and press option 4 to reach your doctor's medical assistant. If no one answers, please leave a voicemail as directed and we will return your call as soon as possible. Messages left after 4 pm will be answered the following business day.   You may also send us  a message via MyChart. We typically respond to MyChart messages within 1-2 business  days.  For prescription refills, please ask your pharmacy to contact our office. Our fax number is [phone].  If you have an urgent issue when the clinic is closed that cannot wait until the next business day, you can page your doctor at the number below.    Please note that while we do our best to be available for urgent issues outside of office hours, we are not available 24/7.   If you have an urgent issue and are unable to reach us , you may choose to seek medical care at your doctor's office, retail clinic, urgent care center, or emergency room.  If you have a medical emergency, please immediately call 911 or go to the emergency department.  Pager Numbers  - Dr. Hester: [phone]  - Dr. Jackquline: [phone]  - Dr. Claudene: [phone]   In the event of inclement weather, please call our main line at [phone] for an update on the status of any delays or closures.  Dermatology Medication Tips: Please keep the boxes that topical medications come in in order to help keep track of the instructions about where and how to use these. Pharmacies typically print the medication instructions only on the boxes and not directly on the medication tubes.   If your medication is too expensive, please contact our office at [phone] option 4 or send us  a message through MyChart.   We are unable to tell what your co-pay for medications will be in advance as this is different depending on your insurance coverage. However, we may be able to find a substitute medication at lower cost or fill out paperwork to get insurance to cover a needed medication.   If a prior authorization is required to get your medication covered by your insurance company, please allow us  1-2 business days to complete this process.  Drug prices often vary depending on where the prescription is filled and some pharmacies may offer cheaper prices.  The website www.goodrx.com contains coupons for medications  through different pharmacies. The prices here do not account for what the cost may be with help from insurance (it may be cheaper with your insurance), but the website can give you the price if you did not use any insurance.  - You can print the associated coupon and take it with your prescription to the pharmacy.  - You may also stop by our office during regular business hours and pick up a GoodRx coupon card.  - If you need your prescription sent electronically to a different pharmacy, notify our office through [facility] or by phone at [phone] option 4.     Si Usted Necesita Algo Despus de Su Visita  Tambin puede enviarnos un mensaje a travs de Clinical Cytogeneticist. Por lo general respondemos a los mensajes de MyChart en el transcurso de 1 a 2 das hbiles.  Para renovar recetas, por favor pida a su farmacia que se ponga en contacto  con nuestra oficina. Randi lakes de fax es [place] [phone].  Si tiene un asunto urgente cuando la clnica est cerrada y que no puede esperar hasta el siguiente da hbil, puede llamar/localizar a su doctor(a) al nmero que aparece a continuacin.   Por favor, tenga en cuenta que aunque hacemos todo lo posible para estar disponibles para asuntos urgentes fuera del horario de [place], no estamos disponibles las 24 horas del da, los 7 [address]  [address] de la [place].   Si tiene un problema urgente y no puede comunicarse con nosotros, puede optar por buscar atencin mdica  en el consultorio de su doctor(a), en una clnica privada, en un centro de atencin urgente o en una sala de emergencias.  Si tiene engineer, drilling, por favor llame inmediatamente al 911 o vaya a la sala de emergencias.  Nmeros de bper  - Dr. Hester: [phone]  - Dra. Jackquline: [phone]  - Dr. Claudene: [phone]   En caso de inclemencias del tiempo, por favor llame a landry capes principal al [phone] para una actualizacin sobre el [place] de cualquier retraso o  cierre.  Consejos para la medicacin en dermatologa: Por favor, guarde las cajas en las que vienen los medicamentos de uso tpico para ayudarle a seguir las instrucciones sobre dnde y cmo usarlos. Las farmacias generalmente imprimen las instrucciones del medicamento slo en las cajas y no directamente en los tubos del [place].   Si su medicamento es muy caro, por favor, pngase en contacto con landry rieger llamando al [phone] y presione la opcin 4 o envenos un mensaje a travs de Clinical Cytogeneticist.   No podemos decirle cul ser su copago por los medicamentos por adelantado ya que esto es diferente dependiendo de la cobertura de su seguro. Sin embargo, es posible que podamos encontrar un medicamento sustituto a audiological scientist un formulario para que el seguro cubra el medicamento que se considera necesario.   Si se requiere una autorizacin previa para que su compaa de seguros cubra su medicamento, por favor permtanos de 1 a 2 das hbiles para completar este proceso.  Los precios de los medicamentos varan con frecuencia dependiendo del environmental consultant de dnde se surte la receta y alguna farmacias pueden ofrecer precios ms baratos.  El sitio web www.goodrx.com tiene cupones para medicamentos de health and safety inspector. Los precios aqu no tienen en cuenta lo que podra costar con la ayuda del seguro (puede ser ms barato con su seguro), pero el sitio web puede darle el precio si no utiliz tourist information centre manager.  - Puede imprimir el cupn correspondiente y llevarlo con su receta a la farmacia.  - Tambin puede pasar por nuestra oficina durante el horario de atencin regular y education officer, museum una tarjeta de cupones de GoodRx.  - Si necesita que su receta se enve electrnicamente a una farmacia diferente, informe a nuestra oficina a travs de MyChart de [place] o por telfono llamando al [phone] y presione la opcin 4.

## 2023-05-26 NOTE — Progress Notes (Signed)
 Follow-Up Visit   Subjective  Nicole Chavez is a 58 y.o. female who presents for the following: Skin Cancer Screening and Full Body Skin Exam. Hx of BCC. Hx of actinic keratoses. Hx of spindle cell melanocytic lesion at right nasal ala.   Spots to check above left eyebrow, right breast, right forearm. Recheck dark spot at left lower lip. May be a little larger?  Came up in 2023 and diagnosed as a benign melanotic macule.  Would like to discuss TCA peels for face and chest for actinic damage.  Refill Tretinoin    The patient presents for Total-Body Skin Exam (TBSE) for skin cancer screening and mole check. The patient has spots, moles and lesions to be evaluated, some may be new or changing and the patient may have concern these could be cancer.    The following portions of the chart were reviewed this encounter and updated as appropriate: medications, allergies, medical history  Review of Systems:  No other skin or systemic complaints except as noted in HPI or Assessment and Plan.  Objective  Well appearing patient in no apparent distress; mood and affect are within normal limits.  A full examination was performed including scalp, head, eyes, ears, nose, lips, neck, chest, axillae, abdomen, back, buttocks, bilateral upper extremities, bilateral lower extremities, hands, feet, fingers, toes, fingernails, and toenails. All findings within normal limits unless otherwise noted below.   Relevant physical exam findings are noted in the Assessment and Plan.         Assessment & Plan   HISTORY OF BASAL CELL CARCINOMA OF THE SKIN. Right superior shoulder. EDC 09/11/2020  - No evidence of recurrence today - Recommend regular full body skin exams - Recommend daily broad spectrum sunscreen SPF 30+ to sun-exposed areas, reapply every 2 hours as needed.  - Call if any new or changing lesions are noted between office visits   SKIN CANCER SCREENING PERFORMED TODAY.  ACTINIC DAMAGE WITH  PRECANCEROUS ACTINIC KERATOSES Counseling for Topical Chemotherapy Management: Patient exhibits: - Severe, confluent actinic changes with pre-cancerous actinic keratoses that is secondary to cumulative UV radiation exposure over time - Condition that is severe; chronic, not at goal. - diffuse scaly erythematous macules and papules with underlying dyspigmentation - Discussed Prescription Field Treatment topical Chemotherapy for Severe, Chronic Confluent Actinic Changes with Pre-Cancerous Actinic Keratoses Field treatment involves treatment of an entire area of skin that has confluent Actinic Changes (Sun/ Ultraviolet light damage) and PreCancerous Actinic Keratoses by method of PhotoDynamic Therapy (PDT) and/or prescription Topical Chemotherapy agents such as 5-fluorouracil, 5-fluorouracil/calcipotriene, and/or imiquimod.  The purpose is to decrease the number of clinically evident and subclinical PreCancerous lesions to prevent progression to development of skin cancer by chemically destroying early precancer changes that may or may not be visible.  It has been shown to reduce the risk of developing skin cancer in the treated area. As a result of treatment, redness, scaling, crusting, and open sores may occur during treatment course. One or more than one of these methods may be used and may have to be used several times to control, suppress and eliminate the PreCancerous changes. Discussed treatment course, expected reaction, and possible side effects. - Recommend daily broad spectrum sunscreen SPF 30+ to sun-exposed areas, reapply every 2 hours as needed.  - Staying in the shade or wearing long sleeves, sun glasses (UVA+UVB protection) and wide brim hats (4-inch brim around the entire circumference of the hat) are also recommended. - Call for new or changing lesions.  Apply 5-fluorouracil cream followed by calcipotriene cream twice a day for 7 days to affected areas at the body. Right forearm.  Instructions for [facility] Medications  One or more of your medications was sent to the [facility] mail order compounding pharmacy. You will receive an email from them and can purchase the medicine through that link. It will then be mailed to your home at the address you confirmed. If for any reason you do not receive an email from them, please check your spam folder. If you still do not find the email, please let us  know. [facility] phone number is [phone].    Reviewed course of treatment and expected reaction.  Patient advised to expect inflammation and crusting and advised that erosions are possible.  Patient advised to be diligent with sun protection during and after treatment. Handout with details of how to apply medication and what to expect provided. Counseled to keep medication out of reach of children and pets.  LENTIGINES, SEBORRHEIC KERATOSES, HEMANGIOMAS - Benign normal skin lesions - Benign-appearing - Call for any changes  MELANOCYTIC NEVI - Tan-brown and/or pink-flesh-colored symmetric macules and papules - Benign appearing on exam today - Observation - Call clinic for new or changing moles - Recommend daily use of broad spectrum spf 30+ sunscreen to sun-exposed areas.   DERMATOFIBROMA Exam: Firm pink/brown papulenodule with dimple sign at right posterior shoulder. Treatment Plan: A dermatofibroma is a benign growth possibly related to trauma, such as an insect bite, cut from shaving, or inflamed acne-type bump.  Treatment options to remove include shave or excision with resulting scar and risk of recurrence.  Since benign-appearing and not bothersome, will observe for now.    MELANOCYTIC NEVI Exam: area not examined today at right labia majora    Treatment Plan: Benign.  Recommend observation. Call clinic for new or changing moles. Recommend daily use of broad spectrum spf 30+ sunscreen to sun-exposed areas.    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 - Continue to monitor.  - Recommend regular full body skin exams - Recommend daily broad spectrum sunscreen SPF 30+ to sun-exposed areas, reapply every 2 hours as needed.  - Call if any new or changing lesions are noted between office visits   LENTIGINES, Actinic damage, elastosis Exam: scattered tan macules at face and chest. Due to sun exposure  Treatment Plan:  Discussed TCA peels. Will research procedure to see if effective on chest.  Recommend BBL on face. Plan 2-3 treatments for best results.   Discussed PDT  Counseling for BBL / IPL / Laser and Coordination of Care Discussed the treatment option of Broad Band Light (BBL) /Intense Pulsed Light (IPL)/ Laser for skin discoloration, including brown spots and redness.  Typically we recommend at least 1-3 treatment sessions about 5-8 weeks apart for best results.  Cannot have tanned skin when BBL performed, and regular use of sunscreen/photoprotection is advised after the procedure to help maintain results. The patient's condition may also require maintenance treatments in the future.  The fee for BBL / laser treatments is $350 per treatment session for the whole face.  A fee can be quoted for other parts of the body.  Insurance typically does not pay for BBL/laser treatments and therefore the fee is an out-of-pocket cost. Recommend prophylactic valtrex  treatment. Once scheduled for procedure, will send Rx in prior to patient's appointment.     Nevus vs Lentigo  Exam: R  nasal tip nevus vs lentigo 4 mm tan slightly depressed macule  Treatment: Pt states has been present for many years.  Was biopsied and was benign Benign-appearing.  Observation.  Call clinic for new or changing moles.  Recommend daily use of broad spectrum spf 30+ sunscreen to sun-exposed areas.        SEBORRHEIC KERATOSIS - Stuck-on, waxy, tan-brown papules and/or plaques  at scalp -  Benign-appearing - Discussed benign etiology and prognosis. - Observe - Call for any changes   FACIAL ELASTOSIS Exam: Rhytides and volume loss.  Treatment Plan: Continue Tretinoin  cream to face at bedtime, wash off in morning.   Topical retinoid medications like tretinoin /Retin-A , adapalene/Differin, tazarotene/Fabior, and Epiduo/Epiduo Forte can cause dryness and irritation when first started. Only apply a pea-sized amount to the entire affected area. Avoid applying it around the eyes, edges of mouth and creases at the nose. If you experience irritation, use a good moisturizer first and/or apply the medicine less often. If you are doing well with the medicine, you can increase how often you use it until you are applying every night. Be careful with sun protection while using this medication as it can make you sensitive to the sun. This medicine should not be used by pregnant women.    Recommend daily broad spectrum sunscreen SPF 30+ to sun-exposed areas, reapply every 2 hours as needed. Call for new or changing lesions.  Staying in the shade or wearing long sleeves, sun glasses (UVA+UVB protection) and wide brim hats (4-inch brim around the entire circumference of the hat) are also recommended for sun protection.   Lentigo/Melanotic Macule vs other 4 mm gray-dark brown macule at left lower vermilion lip  Treatment:  Plan biopsy to r/o atypia within the next week.   Return in about 6 months (around 11/23/2023) for TBSE; biopsy on lip within the next week.  I, Jill Parcell, CMA, am acting as scribe for Rexene Rattler, MD.   Documentation: I have reviewed the above documentation for accuracy and completeness, and I agree with the above.  Rexene Rattler, MD

## 2023-05-28 ENCOUNTER — Telehealth: Payer: Self-pay

## 2023-05-28 NOTE — Telephone Encounter (Signed)
Called patient and advised the following about upcoming office visit/bx per Dr. Roseanne Reno: "The code would be for a lip biopsy.  14782, neoplasm uncertain behavior D48.9, please call pt.  If that is all we do and I don't address any other skin problems, there wouldn't be a office visit code, just the procedure code."  Also advised pt that there would be a fee from the pathologist./sh

## 2023-06-04 ENCOUNTER — Ambulatory Visit: Payer: BC Managed Care – PPO | Admitting: Dermatology

## 2023-06-04 DIAGNOSIS — L814 Other melanin hyperpigmentation: Secondary | ICD-10-CM

## 2023-06-04 DIAGNOSIS — D492 Neoplasm of unspecified behavior of bone, soft tissue, and skin: Secondary | ICD-10-CM

## 2023-06-04 DIAGNOSIS — L988 Other specified disorders of the skin and subcutaneous tissue: Secondary | ICD-10-CM

## 2023-06-04 DIAGNOSIS — D485 Neoplasm of uncertain behavior of skin: Secondary | ICD-10-CM

## 2023-06-04 MED ORDER — TRETINOIN 0.05 % EX CREA: TOPICAL_CREAM | Freq: Every day | CUTANEOUS | 5 refills | Status: AC

## 2023-06-04 NOTE — Patient Instructions (Signed)

## 2023-06-04 NOTE — Progress Notes (Signed)
   Follow-Up Visit   Subjective  Nicole Chavez is a 58 y.o. female who presents for the following: pt here for biopsy to r/o atypia.   The following portions of the chart were reviewed this encounter and updated as appropriate: medications, allergies, medical history  Review of Systems:  No other skin or systemic complaints except as noted in HPI or Assessment and Plan.  Objective  Well appearing patient in no apparent distress; mood and affect are within normal limits.  A focused examination was performed of the following areas: face  Relevant exam findings are noted in the Assessment and Plan.  left lower vermilion lip 4 mm gray-dark brown macule    Assessment & Plan     NEOPLASM OF UNCERTAIN BEHAVIOR OF SKIN left lower vermilion lip Epidermal / dermal shaving  Lesion diameter (cm):  0.5 Informed consent: discussed and consent obtained   Patient was prepped and draped in usual sterile fashion: Area prepped with alcohol. Anesthesia: the lesion was anesthetized in a standard fashion   Anesthetic:  1% lidocaine w/ epinephrine 1-100,000 buffered w/ 8.4% NaHCO3 Instrument used: flexible razor blade   Hemostasis achieved with: pressure, aluminum chloride and electrodesiccation   Outcome: patient tolerated procedure well   Post-procedure details: wound care instructions given   Post-procedure details comment:  Ointment and small bandage applied.  Specimen 1 - Surgical pathology Differential Diagnosis: Lentigo/Melanotic Macule vs other r/o atypia  Check Margins: No 4 mm gray-dark brown macule ELASTOSIS OF SKIN   Related Medications tretinoin (RETIN-A) 0.05 % cream Apply topically at bedtime.  Return for TBSE, as scheduled, with Dr. Roseanne Reno.  Anise Salvo, RMA, am acting as scribe for Willeen Niece, MD .   Documentation: I have reviewed the above documentation for accuracy and completeness, and I agree with the above.  Willeen Niece, MD

## 2023-06-08 ENCOUNTER — Telehealth: Payer: Self-pay

## 2023-06-08 LAB — SURGICAL PATHOLOGY

## 2023-06-08 NOTE — Telephone Encounter (Signed)
Advised pt of bx results/sh ?

## 2023-06-08 NOTE — Telephone Encounter (Signed)
-----   Message from Willeen Niece sent at 06/08/2023  5:26 PM EST ----- 1. Skin, left lower vermilion lip :       LABIAL MELANOTIC MACULE (LENTIGO), IRRITATED, INFLAMED   Benign irritated lip freckle, no further treatment needed - please call patient

## 2023-06-30 ENCOUNTER — Ambulatory Visit: Payer: BC Managed Care – PPO | Admitting: Dermatology

## 2023-07-06 ENCOUNTER — Telehealth: Payer: Self-pay

## 2023-07-06 NOTE — Telephone Encounter (Signed)
 Patient called, she discussed with Dr Roseanne Reno TCA chemical peel on her chest, she was originally suppose to receive by Dr Neale Burly but she never did, Dr Roseanne Reno told her during her office visit she would research TCA peel and let her know if Dr Roseanne Reno would do the TCA peel

## 2023-07-06 NOTE — Telephone Encounter (Signed)
 Discussed dr Zannie Kehr treatment options for actinic damage on patients chest

## 2023-07-16 ENCOUNTER — Other Ambulatory Visit: Payer: Self-pay | Admitting: Dermatology

## 2023-12-15 ENCOUNTER — Ambulatory Visit: Payer: BC Managed Care – PPO | Admitting: Dermatology

## 2024-01-05 ENCOUNTER — Ambulatory Visit: Payer: BC Managed Care – PPO | Admitting: Dermatology

## 2024-02-02 ENCOUNTER — Ambulatory Visit: Admitting: Dermatology

## 2024-02-02 DIAGNOSIS — Z7189 Other specified counseling: Secondary | ICD-10-CM

## 2024-02-02 DIAGNOSIS — Z79899 Other long term (current) drug therapy: Secondary | ICD-10-CM

## 2024-02-02 DIAGNOSIS — B078 Other viral warts: Secondary | ICD-10-CM | POA: Diagnosis not present

## 2024-02-02 DIAGNOSIS — Z1283 Encounter for screening for malignant neoplasm of skin: Secondary | ICD-10-CM | POA: Diagnosis not present

## 2024-02-02 DIAGNOSIS — L578 Other skin changes due to chronic exposure to nonionizing radiation: Secondary | ICD-10-CM

## 2024-02-02 DIAGNOSIS — D1801 Hemangioma of skin and subcutaneous tissue: Secondary | ICD-10-CM

## 2024-02-02 DIAGNOSIS — Z85828 Personal history of other malignant neoplasm of skin: Secondary | ICD-10-CM

## 2024-02-02 DIAGNOSIS — L988 Other specified disorders of the skin and subcutaneous tissue: Secondary | ICD-10-CM

## 2024-02-02 DIAGNOSIS — D229 Melanocytic nevi, unspecified: Secondary | ICD-10-CM

## 2024-02-02 DIAGNOSIS — L905 Scar conditions and fibrosis of skin: Secondary | ICD-10-CM

## 2024-02-02 DIAGNOSIS — L821 Other seborrheic keratosis: Secondary | ICD-10-CM

## 2024-02-02 DIAGNOSIS — L814 Other melanin hyperpigmentation: Secondary | ICD-10-CM

## 2024-02-02 NOTE — Patient Instructions (Addendum)
 Discussed cosmetic procedure cryotherapy, noncovered.  $60 for 1st lesion and $15 for each additional lesion if done on the same day.  Maximum charge $350.  One touch-up treatment included no charge. Discussed risks of treatment including dyspigmentation, small scar, and/or recurrence. Recommend daily broad spectrum sunscreen SPF 30+/photoprotection to treated areas once healed.  Counseling for BBL / IPL / Laser and Coordination of Care Discussed the treatment option of Broad Band Light (BBL) /Intense Pulsed Light (IPL)/ Laser for skin discoloration, including brown spots and redness.  Typically we recommend at least 1-3 treatment sessions about 5-8 weeks apart for best results.  Cannot have tanned skin when BBL performed, and regular use of sunscreen/photoprotection is advised after the procedure to help maintain results. The patient's condition may also require maintenance treatments in the future.  The fee for BBL / laser treatments is $350 per treatment session for the whole face.  A fee can be quoted for other parts of the body.  Insurance typically does not pay for BBL/laser treatments and therefore the fee is an out-of-pocket cost. Recommend prophylactic valtrex  treatment. Once scheduled for procedure, will send Rx in prior to patient's appointment.    Due to recent changes in healthcare laws, you may see results of your pathology and/or laboratory studies on MyChart before the doctors have had a chance to review them. We understand that in some cases there may be results that are confusing or concerning to you. Please understand that not all results are received at the same time and often the doctors may need to interpret multiple results in order to provide you with the best plan of care or course of treatment. Therefore, we ask that you please give us  2 business days to thoroughly review all your results before contacting the office for clarification. Should we see a critical lab result, you will  be contacted sooner.   If You Need Anything After Your Visit  If you have any questions or concerns for your doctor, please call our main line at [phone] and press option 4 to reach your doctor's medical assistant. If no one answers, please leave a voicemail as directed and we will return your call as soon as possible. Messages left after 4 pm will be answered the following business day.   You may also send us  a message via MyChart. We typically respond to MyChart messages within 1-2 business days.  For prescription refills, please ask your pharmacy to contact our office. Our fax number is [phone].  If you have an urgent issue when the clinic is closed that cannot wait until the next business day, you can page your doctor at the number below.    Please note that while we do our best to be available for urgent issues outside of office hours, we are not available 24/7.   If you have an urgent issue and are unable to reach us , you may choose to seek medical care at your doctor's office, retail clinic, urgent care center, or emergency room.  If you have a medical emergency, please immediately call 911 or go to the emergency department.  Pager Numbers  - Dr. Hester: [phone]  - Dr. Jackquline: [phone]  - Dr. Claudene: [phone]   - Dr. Raymund: [phone]  In the event of inclement weather, please call our main line at [phone] for an update on the status of any delays or closures.  Dermatology Medication Tips: Please keep the boxes that topical medications come in in order to help  keep track of the instructions about where and how to use these. Pharmacies typically print the medication instructions only on the boxes and not directly on the medication tubes.   If your medication is too expensive, please contact our office at [phone] option 4 or send us  a message through MyChart.   We are unable to tell what your co-pay for medications will be in advance as  this is different depending on your insurance coverage. However, we may be able to find a substitute medication at lower cost or fill out paperwork to get insurance to cover a needed medication.   If a prior authorization is required to get your medication covered by your insurance company, please allow us  1-2 business days to complete this process.  Drug prices often vary depending on where the prescription is filled and some pharmacies may offer cheaper prices.  The website www.goodrx.com contains coupons for medications through different pharmacies. The prices here do not account for what the cost may be with help from insurance (it may be cheaper with your insurance), but the website can give you the price if you did not use any insurance.  - You can print the associated coupon and take it with your prescription to the pharmacy.  - You may also stop by our office during regular business hours and pick up a GoodRx coupon card.  - If you need your prescription sent electronically to a different pharmacy, notify our office through [facility] or by phone at [phone] option 4.     Si Usted Necesita Algo Despus de Su Visita  Tambin puede enviarnos un mensaje a travs de Clinical cytogeneticist. Por lo general respondemos a los mensajes de MyChart en el transcurso de 1 a 2 das hbiles.  Para renovar recetas, por favor pida a su farmacia que se ponga en contacto con nuestra oficina. Randi lakes de fax es [place] [phone].  Si tiene un asunto urgente cuando la clnica est cerrada y que no puede esperar hasta el siguiente da hbil, puede llamar/localizar a su doctor(a) al nmero que aparece a continuacin.   Por favor, tenga en cuenta que aunque hacemos todo lo posible para estar disponibles para asuntos urgentes fuera del horario de [place], no estamos disponibles las 24 horas del da, los 7 [address]  [address] de la [place].   Si tiene un problema urgente y no puede comunicarse con nosotros, puede optar por  buscar atencin mdica  en el consultorio de su doctor(a), en una clnica privada, en un centro de atencin urgente o en una sala de emergencias.  Si tiene Engineer, drilling, por favor llame inmediatamente al 911 o vaya a la sala de emergencias.  Nmeros de bper  - Dr. Hester: [phone]  - Dra. Jackquline: [phone]  - Dr. Claudene: [phone]  - Dra. Kitts: [phone]  En caso de inclemencias del [place], por favor llame a nuestra lnea principal al [phone] para una actualizacin sobre el estado de cualquier retraso o cierre.  Consejos para la medicacin en dermatologa: Por favor, guarde las cajas en las que vienen los medicamentos de uso tpico para ayudarle a seguir las instrucciones sobre dnde y cmo usarlos. Las farmacias generalmente imprimen las instrucciones del medicamento slo en las cajas y no directamente en los tubos del [place].   Si su medicamento es muy caro, por favor, pngase en contacto con landry rieger llamando al [phone] y presione la opcin 4 o envenos un mensaje a travs de Clinical cytogeneticist.   No podemos decirle cul  ser su copago por los medicamentos por adelantado ya que esto es diferente dependiendo de la cobertura de su seguro. Sin embargo, es posible que podamos encontrar un medicamento sustituto a Audiological scientist un formulario para que el seguro cubra el medicamento que se considera necesario.   Si se requiere una autorizacin previa para que su compaa de seguros malta su medicamento, por favor permtanos de 1 a 2 das hbiles para completar este proceso.  Los precios de los medicamentos varan con frecuencia dependiendo del Environmental consultant de dnde se surte la receta y alguna farmacias pueden ofrecer precios ms baratos.  El sitio web www.goodrx.com tiene cupones para medicamentos de Health and safety inspector. Los precios aqu no tienen en cuenta lo que podra costar con la ayuda del seguro (puede ser ms barato con su seguro), pero el sitio web  puede darle el precio si no utiliz Tourist information centre manager.  - Puede imprimir el cupn correspondiente y llevarlo con su receta a la farmacia.  - Tambin puede pasar por nuestra oficina durante el horario de atencin regular y Education officer, museum una tarjeta de cupones de GoodRx.  - Si necesita que su receta se enve electrnicamente a una farmacia diferente, informe a nuestra oficina a travs de MyChart de [place] o por telfono llamando al [phone] y presione la opcin 4.

## 2024-02-02 NOTE — Progress Notes (Signed)
 Follow-Up Visit   Subjective  Nicole Chavez is a 58 y.o. female who presents for the following: Skin Cancer Screening and Full Body Skin Exam  The patient presents for Total-Body Skin Exam (TBSE) for skin cancer screening and mole check. The patient has spots, moles and lesions to be evaluated, some may be new or changing.  History of BCC of the right superior shoulder.    The following portions of the chart were reviewed this encounter and updated as appropriate: medications, allergies, medical history  Review of Systems:  No other skin or systemic complaints except as noted in HPI or Assessment and Plan.  Objective  Well appearing patient in no apparent distress; mood and affect are within normal limits.  A full examination was performed including scalp, head, eyes, ears, nose, lips, neck, chest, axillae, abdomen, back, buttocks, bilateral upper extremities, bilateral lower extremities, hands, feet, fingers, toes, fingernails, and toenails. All findings within normal limits unless otherwise noted below.   Relevant physical exam findings are noted in the Assessment and Plan.    left pretibia Firm flat flesh papules.  Assessment & Plan   SKIN CANCER SCREENING PERFORMED TODAY.  ACTINIC DAMAGE WITH PRECANCEROUS ACTINIC KERATOSES Counseling for Topical Chemotherapy Management: Patient exhibits: - Severe, confluent actinic changes with pre-cancerous actinic keratoses that is secondary to cumulative UV radiation exposure over time - Condition that is severe; chronic, not at goal. - diffuse scaly erythematous macules and papules with underlying dyspigmentation - Discussed Prescription Field Treatment topical Chemotherapy for Severe, Chronic Confluent Actinic Changes with Pre-Cancerous Actinic Keratoses Field treatment involves treatment of an entire area of skin that has confluent Actinic Changes (Sun/ Ultraviolet light damage) and PreCancerous Actinic Keratoses by method of  PhotoDynamic Therapy (PDT) and/or prescription Topical Chemotherapy agents such as 5-fluorouracil, 5-fluorouracil/calcipotriene, and/or imiquimod.  The purpose is to decrease the number of clinically evident and subclinical PreCancerous lesions to prevent progression to development of skin cancer by chemically destroying early precancer changes that may or may not be visible.  It has been shown to reduce the risk of developing skin cancer in the treated area. As a result of treatment, redness, scaling, crusting, and open sores may occur during treatment course. One or more than one of these methods may be used and may have to be used several times to control, suppress and eliminate the PreCancerous changes. Discussed treatment course, expected reaction, and possible side effects. - Recommend daily broad spectrum sunscreen SPF 30+ to sun-exposed areas, reapply every 2 hours as needed.  - Staying in the shade or wearing long sleeves, sun glasses (UVA+UVB protection) and wide brim hats (4-inch brim around the entire circumference of the hat) are also recommended. - Call for new or changing lesions. Apply 5-fluorouracil cream followed by calcipotriene cream twice a day for 7 days to affected areas at the body. Patient is familiar with treatment and treats areas off and on, as needed. Patient has refills at [facility].   Reviewed course of treatment and expected reaction.  Patient advised to expect inflammation and crusting and advised that erosions are possible.  Patient advised to be diligent with sun protection during and after treatment. Handout with details of how to apply medication and what to expect provided. Counseled to keep medication out of reach of children and pets.   LENTIGINES, ACTINIC DAMAGE, ELASTOSIS Exam: scattered tan macules at face and chest. Due to sun exposure   Treatment Plan: Info given to patient on [facility] [employer].  Continue  tretinoin  0.1% cream nighty as tolerated dsp  45g 5Rf. Topical retinoid medications like tretinoin /Retin-A , adapalene/Differin, tazarotene/Fabior, and Epiduo/Epiduo Forte can cause dryness and irritation when first started. Only apply a pea-sized amount to the entire affected area. Avoid applying it around the eyes, edges of mouth and creases at the nose. If you experience irritation, use a good moisturizer first and/or apply the medicine less often. If you are doing well with the medicine, you can increase how often you use it until you are applying every night. Be careful with sun protection while using this medication as it can make you sensitive to the sun. This medicine should not be used by pregnant women.   Counseling for BBL / IPL / Laser and Coordination of Care Discussed the treatment option of Broad Band Light (BBL) /Intense Pulsed Light (IPL)/ Laser for skin discoloration, including brown spots and redness.  Typically we recommend at least 1-3 treatment sessions about 5-8 weeks apart for best results.  Cannot have tanned skin when BBL performed, and regular use of sunscreen/photoprotection is advised after the procedure to help maintain results. The patient's condition may also require maintenance treatments in the future.  The fee for BBL / laser treatments is $350 per treatment session for the whole face.  A fee can be quoted for other parts of the body.  Insurance typically does not pay for BBL/laser treatments and therefore the fee is an out-of-pocket cost. Recommend prophylactic valtrex  treatment. Once scheduled for procedure, will send Rx in prior to patient's appointment.     SEBORRHEIC KERATOSES, HEMANGIOMAS - Benign normal skin lesions - Benign-appearing - Call for any changes Discussed cosmetic procedure LN2, noncovered.  $60 for 1st lesion and $15 for each additional lesion if done on the same day.  Maximum charge $350.  One touch-up treatment included no charge. Discussed risks of treatment including dyspigmentation, small  scar, and/or recurrence. Recommend daily broad spectrum sunscreen SPF 30+/photoprotection to treated areas once healed.  MELANOCYTIC NEVI - Tan-brown and/or pink-flesh-colored symmetric macules and papules - right labia majora 0.3 cm medium-dark brown macule, stable - left perineal fleshy brown papule - Benign appearing on exam today - Observation - Call clinic for new or changing moles - Recommend daily use of broad spectrum spf 30+ sunscreen to sun-exposed areas.   HISTORY OF BASAL CELL CARCINOMA OF THE SKIN. Right superior shoulder. EDC 09/11/2020. - No evidence of recurrence today - Recommend regular full body skin exams - Recommend daily broad spectrum sunscreen SPF 30+ to sun-exposed areas, reapply every 2 hours as needed.  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 - Continue to monitor.  - Recommend regular full body skin exams - Recommend daily broad spectrum sunscreen SPF 30+ to sun-exposed areas, reapply every 2 hours as needed.  - Call if any new or changing lesions are noted between office visits OTHER VIRAL WARTS left pretibia Viral Wart (HPV) Counseling  Discussed viral / HPV (Human Papilloma Virus) etiology and risk of spread /infectivity to other areas of body as well as to other people.  Multiple treatments and methods may be required to clear warts and it is possible treatment may not be successful.  Treatment risks include discoloration; scarring and there is still potential for wart recurrence.  Patient defers cryotherapy treatment today. She  will try otc treatment first.  Return in about 6 months (around 08/01/2024) for sun-exposed areas, Hx BCC.  IAndrea Kerns, CMA, am acting as scribe for Rexene Rattler, MD .   Documentation: I have reviewed the above documentation for accuracy  and completeness, and I agree with the above.  Rexene Rattler, MD

## 2024-02-15 ENCOUNTER — Other Ambulatory Visit: Payer: Self-pay | Admitting: Family Medicine

## 2024-02-15 DIAGNOSIS — Z1231 Encounter for screening mammogram for malignant neoplasm of breast: Secondary | ICD-10-CM

## 2024-03-28 ENCOUNTER — Ambulatory Visit
Admission: RE | Admit: 2024-03-28 | Discharge: 2024-03-28 | Disposition: A | Discharge status: Home / Self Care | Source: Ambulatory Visit | Attending: Family Medicine | Admitting: Family Medicine

## 2024-03-28 DIAGNOSIS — Z1231 Encounter for screening mammogram for malignant neoplasm of breast: Secondary | ICD-10-CM | POA: Insufficient documentation

## 2024-05-23 ENCOUNTER — Telehealth: Payer: Self-pay

## 2024-05-23 NOTE — Telephone Encounter (Signed)
 Patient called requesting a refill of [facility] 5FU/Calcipotriene cream.   Rx sent to [facility]

## 2024-08-09 ENCOUNTER — Ambulatory Visit: Admitting: Dermatology
